# Patient Record
Sex: Female | Born: 1975 | Race: White | Hispanic: No | Smoking: Current every day smoker
Health system: Southern US, Community
[De-identification: ages and names within clinical notes are randomized; demographics above are authoritative.]

## PROBLEM LIST (undated history)

## (undated) DIAGNOSIS — D649 Anemia, unspecified: Secondary | ICD-10-CM

## (undated) DIAGNOSIS — F419 Anxiety disorder, unspecified: Secondary | ICD-10-CM

## (undated) DIAGNOSIS — I10 Essential (primary) hypertension: Secondary | ICD-10-CM

## (undated) DIAGNOSIS — E0590 Thyrotoxicosis, unspecified without thyrotoxic crisis or storm: Secondary | ICD-10-CM

## (undated) DIAGNOSIS — E0500 Thyrotoxicosis with diffuse goiter without thyrotoxic crisis or storm: Secondary | ICD-10-CM

## (undated) DIAGNOSIS — Z3480 Encounter for supervision of other normal pregnancy, unspecified trimester: Secondary | ICD-10-CM

## (undated) DIAGNOSIS — Z87440 Personal history of urinary (tract) infections: Secondary | ICD-10-CM

## (undated) DIAGNOSIS — K219 Gastro-esophageal reflux disease without esophagitis: Secondary | ICD-10-CM

## (undated) HISTORY — DX: Thyrotoxicosis, unspecified without thyrotoxic crisis or storm: E05.90

## (undated) HISTORY — DX: Essential (primary) hypertension: I10

## (undated) HISTORY — DX: Personal history of urinary (tract) infections: Z87.440

## (undated) HISTORY — PX: ECTOPIC PREGNANCY SURGERY: SHX613

## (undated) HISTORY — DX: Gastro-esophageal reflux disease without esophagitis: K21.9

## (undated) HISTORY — DX: Anxiety disorder, unspecified: F41.9

## (undated) HISTORY — PX: WISDOM TOOTH EXTRACTION: SHX21

---
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
## 2001-12-28 ENCOUNTER — Emergency Department (HOSPITAL_COMMUNITY): Admission: EM | Admit: 2001-12-28 | Discharge: 2001-12-28 | Payer: Self-pay | Admitting: Emergency Medicine

## 2001-12-28 ENCOUNTER — Encounter: Payer: Self-pay | Admitting: Emergency Medicine

## 2002-05-04 ENCOUNTER — Encounter: Payer: Self-pay | Admitting: *Deleted

## 2002-05-04 ENCOUNTER — Emergency Department (HOSPITAL_COMMUNITY): Admission: EM | Admit: 2002-05-04 | Discharge: 2002-05-04 | Payer: Self-pay | Admitting: *Deleted

## 2004-02-20 ENCOUNTER — Ambulatory Visit (HOSPITAL_COMMUNITY): Admission: RE | Admit: 2004-02-20 | Discharge: 2004-02-20 | Payer: Self-pay | Admitting: Family Medicine

## 2004-02-26 ENCOUNTER — Emergency Department (HOSPITAL_COMMUNITY): Admission: EM | Admit: 2004-02-26 | Discharge: 2004-02-26 | Payer: Self-pay | Admitting: *Deleted

## 2004-03-16 ENCOUNTER — Observation Stay (HOSPITAL_COMMUNITY): Admission: EM | Admit: 2004-03-16 | Discharge: 2004-03-17 | Payer: Self-pay | Admitting: *Deleted

## 2005-12-05 ENCOUNTER — Ambulatory Visit (HOSPITAL_COMMUNITY): Admission: RE | Admit: 2005-12-05 | Discharge: 2005-12-05 | Payer: Self-pay | Admitting: Obstetrics & Gynecology

## 2006-04-16 ENCOUNTER — Inpatient Hospital Stay (HOSPITAL_COMMUNITY): Admission: RE | Admit: 2006-04-16 | Discharge: 2006-04-18 | Payer: Self-pay | Admitting: Obstetrics & Gynecology

## 2007-09-01 ENCOUNTER — Emergency Department (HOSPITAL_COMMUNITY): Admission: EM | Admit: 2007-09-01 | Discharge: 2007-09-01 | Payer: Self-pay | Admitting: Emergency Medicine

## 2009-11-02 ENCOUNTER — Emergency Department (HOSPITAL_COMMUNITY): Admission: EM | Admit: 2009-11-02 | Discharge: 2009-11-02 | Payer: Self-pay | Admitting: Emergency Medicine

## 2010-02-03 ENCOUNTER — Emergency Department (HOSPITAL_COMMUNITY): Admission: EM | Admit: 2010-02-03 | Discharge: 2010-02-03 | Payer: Self-pay | Admitting: Emergency Medicine

## 2010-09-12 ENCOUNTER — Emergency Department (HOSPITAL_COMMUNITY): Admission: EM | Admit: 2010-09-12 | Discharge: 2010-02-18 | Payer: Self-pay | Admitting: Emergency Medicine

## 2010-12-23 LAB — URINALYSIS, ROUTINE W REFLEX MICROSCOPIC
Glucose, UA: NEGATIVE mg/dL
Ketones, ur: NEGATIVE mg/dL
Leukocytes, UA: NEGATIVE
Nitrite: NEGATIVE
Protein, ur: NEGATIVE mg/dL
pH: 6 (ref 5.0–8.0)

## 2010-12-23 LAB — POCT PREGNANCY, URINE: Preg Test, Ur: NEGATIVE

## 2010-12-23 LAB — URINE MICROSCOPIC-ADD ON

## 2011-02-14 ENCOUNTER — Emergency Department (HOSPITAL_COMMUNITY)
Admission: EM | Admit: 2011-02-14 | Discharge: 2011-02-14 | Disposition: A | Discharge status: Home / Self Care | Payer: Medicaid Other | Attending: Emergency Medicine | Admitting: Emergency Medicine

## 2011-02-14 DIAGNOSIS — R110 Nausea: Secondary | ICD-10-CM | POA: Insufficient documentation

## 2011-02-14 DIAGNOSIS — O99891 Other specified diseases and conditions complicating pregnancy: Secondary | ICD-10-CM | POA: Insufficient documentation

## 2011-02-14 LAB — URINE MICROSCOPIC-ADD ON

## 2011-02-14 LAB — URINALYSIS, ROUTINE W REFLEX MICROSCOPIC
Bilirubin Urine: NEGATIVE
Leukocytes, UA: NEGATIVE
Nitrite: NEGATIVE
Urobilinogen, UA: 0.2 mg/dL (ref 0.0–1.0)

## 2011-02-19 ENCOUNTER — Other Ambulatory Visit: Payer: Self-pay | Admitting: Family Medicine

## 2011-02-19 ENCOUNTER — Other Ambulatory Visit (HOSPITAL_COMMUNITY)
Admission: RE | Admit: 2011-02-19 | Discharge: 2011-02-19 | Disposition: A | Discharge status: Home / Self Care | Payer: Medicaid Other | Source: Ambulatory Visit | Attending: Obstetrics and Gynecology | Admitting: Obstetrics and Gynecology

## 2011-02-19 DIAGNOSIS — Z01419 Encounter for gynecological examination (general) (routine) without abnormal findings: Secondary | ICD-10-CM | POA: Insufficient documentation

## 2011-02-19 DIAGNOSIS — Z113 Encounter for screening for infections with a predominantly sexual mode of transmission: Secondary | ICD-10-CM | POA: Insufficient documentation

## 2011-02-21 NOTE — Group Therapy Note (Signed)
NAMEMarland Kitchen  [name], Kristin [name] NO.:  [id number]   MEDICAL RECORD NO.:  [id number]          PATIENT TYPE:  INP   LOCATION:  LDR1                          FACILITY:  [facility]   PHYSICIAN:  Tilda Burrow, M.D. DATE OF BIRTH:  [birth year]   DATE OF PROCEDURE:  DATE OF DISCHARGE:                                   PROGRESS NOTE   Kristin developed a strong urge to push at approximately 1630.  She still had  a anterior lip of cervix; however, it was easily reducible.  She pushed hard  and effectively for almost 2 hours and brought the baby down to crowning  position but just kind of wore out after that.  Dr. Emelda Fear came on to the  floor and a vacuum assistance and readily accepted.  The vacuum was placed  on the vertex and after 3 contractions and with pushing effort from the  patient and assistance with a vacuum the vertex delivered in an OA position.  There is a nuchal core which was not reducible.  The baby was delivered  through it.  The shoulder were delivered using a corkscrew maneuver and  rotating the baby into an oblique position.  Birth time is 60, Apgar's are  8 and 9.  The weight is 6 pounds even.  Twenty units of Pitocin diluted in  1000 cc of lactated ringers was infused rapidly IV.  The placenta separated  spontaneously; however, when controlled core traction was applied the cord  broke loose.  The patient was sat up into a squatting position and after one  push delivered the placenta.  It was inspected and appears to be intact with  a 3 vessel cord.  The vagina was then inspected and a second degree  laceration was found.  It was infiltrated with 15 cc of 1% Xylocaine and  repaired with a 2-0 Vicryl suture.  The epidural catheter was then removed  with  the blue tip visualized as being intact.  The placenta was delivered  at 1842.   ESTIMATED BLOOD LOSS:  250 cc.      Jacklyn Shell, C.N.M.      Tilda Burrow, M.D.  Electronically  Signed    FC/MEDQ  D:  04/16/2006  T:  04/16/2006  Job:  [id number]

## 2011-02-21 NOTE — H&P (Signed)
NAMEMarland Kitchen  Kristin [name], Kristin [name] NO.:  [id number]   MEDICAL RECORD NO.:  [id number]          PATIENT TYPE:  INP   LOCATION:  LDR1                          FACILITY:  [facility]   PHYSICIAN:  Tilda Burrow, M.D. DATE OF BIRTH:  1975/12/01   DATE OF ADMISSION:  04/16/2006  DATE OF DISCHARGE:  LH                                HISTORY & PHYSICAL   HISTORY OF PRESENT ILLNESS:  Kristin [name] is a 35 year old gravida 4, para 1, EDC  May 02, 2006, who was awakened this morning by first leaking of fluid at  approximately 3:20/3:15 and having uterine contractions.   MEDICAL HISTORY:  Negative.   SURGICAL HISTORY:  Positive for:  1.  D&C.  2.  Laparotomy for ectopic pregnancy.  3.  Wisdom tooth.   ALLERGIES:  She has no known allergies.   PRENATAL COURSE:  Essentially uneventful.  There was some issue with pain  management, but after discussion with the patient, that seemed to dissipate.  Blood type is O positive.  Rubella is immune.  Hepatitis B surface antigen  is negative.  HIV is negative.  Serology is nonreactive.  Dipstick of urine  negative.  Pap normal.  Twenty-eight-week hemoglobin 11.1, hematocrit 33.1.  One-hour glucose is 143.  GBS is negative.  Three-hour glucose is as  follows:  68, 104, 133 and 104.   PHYSICAL EXAMINATION:  VITAL SIGNS:  Stable.  Fetal heart rate pattern is  stable.  PELVIC:  She has gross rupture of membranes with a moderate-to-large amount  of clear fluid noted on admission.  Cervix is 3 cm, 80% effaced and -1  station.   PLAN:  We are going to admit and expect vaginal delivery.      Zerita Boers, Lanier Clam      Tilda Burrow, M.D.  Electronically Signed    DL/MEDQ  D:  16/07/9603  T:  04/16/2006  Job:  [id number]   cc:   Tilda Burrow, M.D.  Fax: [phone]   Francoise Schaumann. Milford Cage DO, FAAP  Fax: [phone]

## 2011-02-21 NOTE — Group Therapy Note (Signed)
NAME:  Kristin [name], Kristin [name] NO.:  [id number]   MEDICAL RECORD NO.:  [id number]          PATIENT TYPE:  OIB   LOCATION:  A416                          FACILITY:  [facility]   PHYSICIAN:  Tilda Burrow, M.D. DATE OF BIRTH:  [birth year]   DATE OF PROCEDURE:  DATE OF DISCHARGE:  12/05/2005                                   PROGRESS NOTE   HISTORY OF PRESENT ILLNESS:  [name] came to the office this morning with  complaints of abdominal pain that started about 45 minutes prior to arrival.  It was in her suprapubic area and radiated around to her left flank.  The  urine did look quite funky and she was having complaints of urinary  frequency.  She had blood in her urine and white blood cells, so she was  treated presumably for a UTI.  However, it did sound like there may be some  intestinal component to it also.  I have discharged her from the office with  instructions to get a Fleets enema at the drug store and if she did not get  relief from that, to call me back.  However, her pain increased and she went  ahead and came straight over to the hospital.  She received a milk and  molasses enema which she had good results and felt amazingly better after a  very good bowel movement.  She was given a prescription for Macrobid 100 mg  b.i.d. x7 for her UTI.   IMPRESSION:  1.  Intrauterine pregnancy at 18 weeks.  2.  Constipation relieved with enema.      Jacklyn Shell, C.N.M.      Tilda Burrow, M.D.  Electronically Signed    FC/MEDQ  D:  12/05/2005  T:  12/05/2005  Job:  [id number]   cc:   [facility]

## 2011-02-21 NOTE — Op Note (Signed)
NAMENAOMII, KREGER NO.:  [id number]   MEDICAL RECORD NO.:  [id number]          PATIENT TYPE:  INP   LOCATION:  A403                          FACILITY:  [facility]   PHYSICIAN:  Tilda Burrow, M.D. DATE OF BIRTH:  1976-09-26   DATE OF PROCEDURE:  04/16/2006  DATE OF DISCHARGE:  04/18/2006                                 OPERATIVE REPORT   PROCEDURE:  Epidural catheter placement.   INDICATION:  Active labor.   This patient was placed in the sitting position, flexed forward and back  prepped and draped in the usual fashion.  Continuous lumbar epidural  catheter was placed using loss-of-resistance technique identifying the  epidural space, with epidural catheter inserted a couple centimeters into  the epidural space.  The needle removed and catheter taped to the back.  A 5  mL test dose of 1.5% lidocaine with epinephrine initial bolus performed and  then 14 mL/h continuous infusion performed.  The patient tolerated procedure  well with symmetric analgesic effect.      Tilda Burrow, M.D.  Electronically Signed     JVF/MEDQ  D:  04/21/2006  T:  04/22/2006  Job:  [id number]

## 2011-02-21 NOTE — H&P (Signed)
NAMEJAIDE, [name] NO.:  [id number]   MEDICAL RECORD NO.:  [id number]          PATIENT TYPE:  INP   LOCATION:  LDR1                          FACILITY:  [facility]   PHYSICIAN:  Tilda Burrow, M.D. DATE OF BIRTH:  [birth year]   DATE OF ADMISSION:  04/16/2006  DATE OF DISCHARGE:  LH                                HISTORY & PHYSICAL   CHIEF COMPLAINT:  Spontaneous rupture of membranes at 0400.   HISTORY OF PRESENT ILLNESS:  Kristin [name] is a 35 year old gravida 4, para 0, AB 3  with an EDC of 05/02/06 based on first trimester ultrasound placing her at  about 37-1/[redacted] weeks gestation.  She began prenatal care early in her first  trimester and has had regular visits since then.  The pregnancy course has  basically been uneventful.  Prenatal blood pressures included 100-140/60-80.  Labs shows blood type 0 positive, rubella immune, HB, SAG, HIV RPR,  gonorrhea, Chlamydia area all negative.  Group B Strep. Is also negative.  A  1 hour GTT was abnormal at 143, however, her 3 hour GTT was subsequently  normal.  The total weight gain has been about 25 pounds with appropriate  fundal height growth.   PAST MEDICAL HISTORY:  Noncontributory.   PAST SURGICAL HISTORY:  Positive for D&C, 2 ectopic pregnancies and wisdom  teeth.   ALLERGIES:  No known drug allergies.   SOCIAL HISTORY:  She smokes 1/2 pack of cigarettes a day.  She denies  alcohol or drug use.   FAMILY HISTORY:  Noncontributory.   PHYSICAL EXAMINATION:  HEENT:  Within normal limits.  HEART:  Regular rate and rhythm.  LUNGS:  Clear.  ABDOMEN:  Soft and nontender.  She is having contractions about every 3  minutes of moderate intensity, fetal heart rate is reactive without  decelerations with baseline in the 140 range.  PELVIC EXAM;  On cervical exam it is 3 cm, 80% effaced, -1 station.  She has  leaking water, amount of clear amniotic fluid.  EXTREMITIES:  Legs show trace edema.   IMPRESSION:  Intrauterine  pregnancy at 37-1/2 weeks, ruptured membranes,  beginning labor.   PLAN:  The patient plans an epidural as she has had 2 doses of IV pain  medication since arrival here and is now requesting an epidural.  We will  probably start Pitocin, seeing as how her cervix is unchanged from admission  4 hours ago.      Jacklyn Shell, C.N.M.      Tilda Burrow, M.D.  Electronically Signed    FC/MEDQ  D:  04/16/2006  T:  04/16/2006  Job:  [id number]

## 2011-02-21 NOTE — H&P (Signed)
NAME:  Kristin [name], Kristin [name] NO.:  [id number]   MEDICAL RECORD NO.:  [id number]                   PATIENT TYPE:  INP   LOCATION:  A417                                 FACILITY:  [facility]   PHYSICIAN:  Lazaro Arms, M.D.                DATE OF BIRTH:  May 27, 1976   DATE OF ADMISSION:  03/15/2004  DATE OF DISCHARGE:                                HISTORY & PHYSICAL   Kristin [name] is a 35 year old white female, gravida 3, para 0, spontaneous  miscarriage 1, ectopic 1, with last menstrual period she states May 27.  The  patient states that was a normal period; she bled for a full 6 days, and it  was normal volume.  She began experiencing pain in the lower abdomen at  approximately 8 o'clock on June 9.  She presented to the ER last evening  complaining of lower pelvic pain.  A quantitative HCG was drawn and is 979.  She had a vaginal probe ultrasound which showed free fluid in the pelvis.  They actually read it out as being hemoperitoneum, but I viewed it myself,  and it could be a ruptured hemorrhagic corpus luteum.  In any event, the  adnexa is completely negative.  The endometrial stripe is quite thin, almost  not even a leuteal phase endometrial stripe.   On examination this morning, she has I would say mild lower abdominal  tenderness.  There is no rebound at all.  Pelvic exam is not performed  today.  The patient states her pain is much better.  She is admitted,  however, for observation, evaluation, and laboratory data.   PAST MEDICAL HISTORY:  Negative.   PAST SURGICAL HISTORY:  She had the laparotomy for ectopic.  She is unsure  which side, and she is not sure if they saved her tube.  Additionally, she  had a D&C for miscarriage.   PAST OBSTETRICAL HISTORY:  As above.   ALLERGIES:  None.   MEDICATIONS:  She has recently taken ciprofloxacin for urinary tract  infection.   PHYSICAL EXAMINATION:  HEENT:  Unremarkable.  NECK:  Thyroid normal.  LUNGS:   Clear.  HEART:  Respiratory rate without regurgitation or gallop.  BREASTS:  Deferred.  ABDOMEN:  Benign except for some tenderness in the lower abdomen, both  sides, no rebound.  One side is not greater than the other.  EXTREMITIES:  Warm with no edema.  NEUROLOGIC:  Grossly intact.   IMPRESSION:  1. Early pregnancy, questionable ectopic versus intrauterine versus     nonviable.  2. Free fluid in the pelvis, could be blood or could be fluid from a     ruptured hemorrhagic corpus luteum.   PLAN:  The patient is admitted for observation. We will repeat her labs in  the morning with a CBC, quantitative HCG, and serum progesterone.  Based on  that, we will proceed from there.  She  is kept n.p.o. overnight.     ___________________________________________                                         Lazaro Arms, M.D.   LHE/MEDQ  D:  03/16/2004  T:  03/16/2004  Job:  [id number]

## 2011-05-08 ENCOUNTER — Encounter (HOSPITAL_COMMUNITY): Payer: Self-pay | Admitting: *Deleted

## 2011-05-08 ENCOUNTER — Emergency Department (HOSPITAL_COMMUNITY)
Admission: EM | Admit: 2011-05-08 | Discharge: 2011-05-08 | Discharge status: Against Medical Advice | Payer: Medicaid Other | Attending: Emergency Medicine | Admitting: Emergency Medicine

## 2011-05-08 ENCOUNTER — Encounter (HOSPITAL_COMMUNITY): Payer: Self-pay | Admitting: Emergency Medicine

## 2011-05-08 ENCOUNTER — Emergency Department (HOSPITAL_COMMUNITY)
Admission: EM | Admit: 2011-05-08 | Discharge: 2011-05-08 | Disposition: A | Discharge status: Home / Self Care | Payer: Medicaid Other | Attending: Emergency Medicine | Admitting: Emergency Medicine

## 2011-05-08 DIAGNOSIS — R059 Cough, unspecified: Secondary | ICD-10-CM | POA: Insufficient documentation

## 2011-05-08 DIAGNOSIS — O2390 Unspecified genitourinary tract infection in pregnancy, unspecified trimester: Secondary | ICD-10-CM | POA: Insufficient documentation

## 2011-05-08 DIAGNOSIS — R109 Unspecified abdominal pain: Secondary | ICD-10-CM

## 2011-05-08 DIAGNOSIS — R05 Cough: Secondary | ICD-10-CM | POA: Insufficient documentation

## 2011-05-08 DIAGNOSIS — O26899 Other specified pregnancy related conditions, unspecified trimester: Secondary | ICD-10-CM

## 2011-05-08 DIAGNOSIS — N390 Urinary tract infection, site not specified: Secondary | ICD-10-CM | POA: Insufficient documentation

## 2011-05-08 LAB — BASIC METABOLIC PANEL
CO2: 19 mEq/L (ref 19–32)
Potassium: 3.8 mEq/L (ref 3.5–5.1)

## 2011-05-08 LAB — CBC
Hemoglobin: 12.2 g/dL (ref 12.0–15.0)
MCH: 31 pg (ref 26.0–34.0)
MCV: 89.3 fL (ref 78.0–100.0)
Platelets: 170 10*3/uL (ref 150–400)
RBC: 3.93 MIL/uL (ref 3.87–5.11)
WBC: 13.9 10*3/uL — ABNORMAL HIGH (ref 4.0–10.5)

## 2011-05-08 LAB — URINALYSIS, ROUTINE W REFLEX MICROSCOPIC
Protein, ur: NEGATIVE mg/dL
Specific Gravity, Urine: 1.015 (ref 1.005–1.030)
Urobilinogen, UA: 0.2 mg/dL (ref 0.0–1.0)

## 2011-05-08 LAB — URINE MICROSCOPIC-ADD ON

## 2011-05-08 LAB — DIFFERENTIAL
Basophils Absolute: 0 10*3/uL (ref 0.0–0.1)
Basophils Relative: 0 % (ref 0–1)
Eosinophils Absolute: 0.2 10*3/uL (ref 0.0–0.7)
Eosinophils Relative: 1 % (ref 0–5)
Lymphs Abs: 2 10*3/uL (ref 0.7–4.0)

## 2011-05-08 LAB — POCT PREGNANCY, URINE: Preg Test, Ur: POSITIVE

## 2011-05-08 MED ORDER — NITROFURANTOIN MACROCRYSTAL 100 MG PO CAPS: 100.0000 mg | ORAL_CAPSULE | Freq: Four times a day (QID) | ORAL | Status: AC

## 2011-05-08 NOTE — ED Provider Notes (Addendum)
History    Chart scribed for Hilario Quarry, MD by Enos Fling; the patient was seen in room APA02/APA02; this patient's care was started at 7:15 AM.   CSN: [id number] Arrival date & time: 05/08/2011  4:06 AM  Chief Complaint  Patient presents with  . Abdominal Cramping   HPI Kristin [name] is a 35 y.o. female who presents to the Emergency Department complaining of abd pain. Pt reports she woke up 5 hours ago to urinate, was able to void but c/o dull constant ache to right lower abd at that time, lasting approx 1 hoour. Pain has resolved. Pt has urinated normally since then. No n/v/d. No abd cramping. G2P1LNMP February, December 22nd due date. Prenatal vitamins daily. Ob Dr. Emelda Fear. No past medical history on file.  No past surgical history on file.  No family history on file.  History  Substance Use Topics  . Smoking status: Not on file  . Smokeless tobacco: Not on file  . Alcohol Use: Not on file    OB History    No data available      Review of Systems  Constitutional: Negative for fever, diaphoresis and appetite change.  HENT: Negative for neck pain.   Respiratory: Negative for shortness of breath.   Cardiovascular: Negative for chest pain.  Gastrointestinal: Positive for abdominal pain. Negative for nausea, vomiting and diarrhea.  Genitourinary: Positive for dysuria. Negative for urgency, frequency, vaginal bleeding and pelvic pain.  Musculoskeletal: Negative for myalgias.  Skin: Negative for pallor.  Neurological: Negative for dizziness and headaches.    Physical Exam  BP 104/62  Pulse 70  Resp 16  SpO2 100%  Physical Exam  Constitutional: She is oriented to person, place, and time. She appears well-developed and well-nourished. No distress.  HENT:  Head: Normocephalic.  Right Ear: Tympanic membrane and external ear normal.  Left Ear: Tympanic membrane and external ear normal.  Nose: Nose normal.  Mouth/Throat: Oropharynx is clear and moist.  Eyes: EOM  are normal. Pupils are equal, round, and reactive to light.  Neck: Normal range of motion. Neck supple. No thyromegaly present.  Cardiovascular: Normal rate, regular rhythm, normal heart sounds and intact distal pulses.   Pulmonary/Chest: Effort normal and breath sounds normal.       Lungs CTA with mild coughing.  Abdominal: Soft. Bowel sounds are normal. There is no tenderness.       Gravid consistent with dates  Musculoskeletal: Normal range of motion. She exhibits no edema and no tenderness.  Lymphadenopathy:    She has no cervical adenopathy.  Neurological: She is alert and oriented to person, place, and time.  Skin: Skin is warm and dry.    ED Course  Procedures Results for orders placed during the hospital encounter of 05/08/11  URINALYSIS, ROUTINE W REFLEX MICROSCOPIC      Component Value Range   Color, Urine YELLOW  YELLOW    Appearance HAZY (*) CLEAR    Specific Gravity, Urine 1.015  1.005 - 1.030    pH 7.0  5.0 - 8.0    Glucose, UA NEGATIVE  NEGATIVE (mg/dL)   Hgb urine dipstick TRACE (*) NEGATIVE    Bilirubin Urine NEGATIVE  NEGATIVE    Ketones, ur NEGATIVE  NEGATIVE (mg/dL)   Protein, ur NEGATIVE  NEGATIVE (mg/dL)   Urobilinogen, UA 0.2  0.0 - 1.0 (mg/dL)   Nitrite NEGATIVE  NEGATIVE    Leukocytes, UA NEGATIVE  NEGATIVE   POCT PREGNANCY, URINE  Component Value Range   Preg Test, Ur POSITIVE    URINE MICROSCOPIC-ADD ON      Component Value Range   Squamous Epithelial / LPF MANY (*) RARE    WBC, UA 3-6  <3 (WBC/hpf)   RBC / HPF 3-6  <3 (RBC/hpf)   Bacteria, UA MANY (*) RARE   CBC      Component Value Range   WBC 13.9 (*) 4.0 - 10.5 (K/uL)   RBC 3.93  3.87 - 5.11 (MIL/uL)   Hemoglobin 12.2  12.0 - 15.0 (g/dL)   HCT 47.8 (*) 29.5 - 46.0 (%)   MCV 89.3  78.0 - 100.0 (fL)   MCH 31.0  26.0 - 34.0 (pg)   MCHC 34.8  30.0 - 36.0 (g/dL)   RDW 62.1  30.8 - 65.7 (%)   Platelets 170  150 - 400 (K/uL)  DIFFERENTIAL      Component Value Range   Neutrophils  Relative 80 (*) 43 - 77 (%)   Neutro Abs 11.1 (*) 1.7 - 7.7 (K/uL)   Lymphocytes Relative 14  12 - 46 (%)   Lymphs Abs 2.0  0.7 - 4.0 (K/uL)   Monocytes Relative 5  3 - 12 (%)   Monocytes Absolute 0.7  0.1 - 1.0 (K/uL)   Eosinophils Relative 1  0 - 5 (%)   Eosinophils Absolute 0.2  0.0 - 0.7 (K/uL)   Basophils Relative 0  0 - 1 (%)   Basophils Absolute 0.0  0.0 - 0.1 (K/uL)   WBC Morphology ATYPICAL LYMPHOCYTES    BASIC METABOLIC PANEL      Component Value Range   Sodium 134 (*) 135 - 145 (mEq/L)   Potassium 3.8  3.5 - 5.1 (mEq/L)   Chloride 103  96 - 112 (mEq/L)   CO2 19  19 - 32 (mEq/L)   Glucose, Bld 92  70 - 99 (mg/dL)   BUN 8  6 - 23 (mg/dL)   Creatinine, Ser <8.46 (*) 0.50 - 1.10 (mg/dL)   Calcium 9.1  8.4 - 96.2 (mg/dL)   GFR calc non Af Amer NOT CALCULATED  >60 (mL/min)   GFR calc Af Amer NOT CALCULATED  >60 (mL/min)    MDM Resolved abd pain and minimal dysuria; UA shows UTI; d/c with Macrodantin  I personally performed the services described in this documentation, which was scribed in my presence. The recorded information has been reviewed and considered. No att. providers found       Hilario Quarry, MD 05/09/11 9528  Hilario Quarry, MD 05/09/11 [phone]

## 2011-05-08 NOTE — ED Notes (Signed)
Pt states no pain at present but when came in had a pain that she could not describe to r lower abd radiating around flank. Pain better with urinating. nad at this time. Aware edp will be in asap. Color wnl. No bleeding. States the pain did not feel like a cramping.

## 2011-05-08 NOTE — ED Notes (Signed)
Sudden abd pain on rt side, occurred just when pt woke up to go to bathroom, denies vag. Bleeding or N/V, [redacted] weeks pregnant

## 2011-05-28 ENCOUNTER — Encounter (HOSPITAL_COMMUNITY): Payer: Self-pay | Admitting: Emergency Medicine

## 2011-06-22 ENCOUNTER — Encounter (HOSPITAL_COMMUNITY): Payer: Self-pay | Admitting: *Deleted

## 2011-06-22 ENCOUNTER — Inpatient Hospital Stay (HOSPITAL_COMMUNITY)
Admission: AD | Admit: 2011-06-22 | Discharge: 2011-06-22 | Disposition: A | Discharge status: Home / Self Care | Payer: Medicaid Other | Source: Ambulatory Visit | Attending: Family Medicine | Admitting: Family Medicine

## 2011-06-22 DIAGNOSIS — T148XXA Other injury of unspecified body region, initial encounter: Secondary | ICD-10-CM

## 2011-06-22 DIAGNOSIS — IMO0002 Reserved for concepts with insufficient information to code with codable children: Secondary | ICD-10-CM | POA: Insufficient documentation

## 2011-06-22 DIAGNOSIS — O99891 Other specified diseases and conditions complicating pregnancy: Secondary | ICD-10-CM | POA: Insufficient documentation

## 2011-06-22 DIAGNOSIS — R109 Unspecified abdominal pain: Secondary | ICD-10-CM | POA: Insufficient documentation

## 2011-06-22 NOTE — ED Provider Notes (Signed)
History     Chief Complaint  Patient presents with  . Groin Pain   HPI In with c/o pulling R groin muscle yesterday while playing soccer with her daughter. States felt it pull and then afterwards started having R groin pain.  OB History    Grav Para Term Preterm Abortions TAB SAB Ect Mult Living   4 1 1  2   2  1       Past Medical History  Diagnosis Date  . UTI (lower urinary tract infection)     Past Surgical History  Procedure Date  . Ectopic pregnancy surgery   . No past surgeries     No family history on file.  History  Substance Use Topics  . Smoking status: Current Everyday Smoker -- 0.5 packs/day    Types: Cigarettes  . Smokeless tobacco: Not on file  . Alcohol Use: No    Allergies: No Known Allergies  Prescriptions prior to admission  Medication Sig Dispense Refill  . fluconazole (DIFLUCAN) 150 MG tablet Take 150 mg by mouth once.        . Prenatal Vit-Fe Fumarate-FA (PRENATAL MULTIVITAMIN) 60-1 MG tablet Take 1 tablet by mouth daily.          Review of Systems  Constitutional: Negative.   HENT: Negative.   Eyes: Negative.   Respiratory: Negative.   Cardiovascular: Negative.   Gastrointestinal: Negative.   Genitourinary: Negative.   Musculoskeletal:       R groin pain with ambulation  Skin: Negative.   Neurological: Negative.   Endo/Heme/Allergies: Negative.   Psychiatric/Behavioral: Negative.    Physical Exam   Blood pressure 111/54, pulse 84, temperature 98.3 F (36.8 C), resp. rate 18, height 5\' 4"  (1.626 m), weight 63.05 kg (139 lb).  Physical Exam  Constitutional: She is oriented to person, place, and time. She appears well-developed and well-nourished.  HENT:  Head: Normocephalic.  Cardiovascular: Normal rate, regular rhythm and normal heart sounds.   Respiratory: Effort normal and breath sounds normal.  GI: Soft. Bowel sounds are normal.  Genitourinary: Vagina normal and uterus normal.  Musculoskeletal: Normal range of motion.    Neurological: She is alert and oriented to person, place, and time. She has normal reflexes.  Skin: Skin is warm and dry.  Psychiatric: She has a normal mood and affect. Her behavior is normal. Judgment and thought content normal.    MAU Course  Procedures  MDM   Assessment and Plan  Muscle strain. Motrin 600 tid for up to 1 week.  Zerita Boers 06/22/2011, 12:43 PM

## 2011-06-22 NOTE — Progress Notes (Signed)
Was playing soccer with her son felt pull in right groin and has been very painful ever since, positive fm, no vaginal bleeding.

## 2011-06-22 NOTE — ED Provider Notes (Signed)
Chart reviewed and agree with management and plan.  

## 2011-07-03 ENCOUNTER — Inpatient Hospital Stay (HOSPITAL_COMMUNITY)
Admission: AD | Admit: 2011-07-03 | Discharge: 2011-07-03 | Disposition: A | Discharge status: Home / Self Care | Payer: Self-pay | Source: Ambulatory Visit | Attending: Obstetrics and Gynecology | Admitting: Obstetrics and Gynecology

## 2011-07-03 DIAGNOSIS — R55 Syncope and collapse: Secondary | ICD-10-CM

## 2011-07-03 DIAGNOSIS — D649 Anemia, unspecified: Secondary | ICD-10-CM | POA: Insufficient documentation

## 2011-07-03 DIAGNOSIS — O99019 Anemia complicating pregnancy, unspecified trimester: Secondary | ICD-10-CM | POA: Insufficient documentation

## 2011-07-03 LAB — URINALYSIS, ROUTINE W REFLEX MICROSCOPIC
Bilirubin Urine: NEGATIVE
Glucose, UA: NEGATIVE mg/dL
Hgb urine dipstick: NEGATIVE
Nitrite: NEGATIVE
Specific Gravity, Urine: 1.01 (ref 1.005–1.030)
Urobilinogen, UA: 0.2 mg/dL (ref 0.0–1.0)
pH: 7.5 (ref 5.0–8.0)

## 2011-07-03 LAB — CBC
MCV: 91.7 fL (ref 78.0–100.0)
Platelets: 147 10*3/uL — ABNORMAL LOW (ref 150–400)
RBC: 3.49 MIL/uL — ABNORMAL LOW (ref 3.87–5.11)
WBC: 12.3 10*3/uL — ABNORMAL HIGH (ref 4.0–10.5)

## 2011-07-03 NOTE — Progress Notes (Signed)
Pt reports she has been having dizzy spells for the last 2 weeks, off/on. Today while she was in grocery store she felt like she was going to pass out. Today has had a "discomfort/cramping feeling" in lower abd.

## 2011-07-03 NOTE — Progress Notes (Signed)
SAYS SHE WAS AT [facility]- AND FELT DIZZY - DID NOT PASS OUT - HAS BEEN FEELING LIGHT HEADED-  WENT HOME AND ATE AT 730.   DOES NOT FEEL  NOW LIKE SHE  DID THEN.   DENIES UC.  HAS APPOINTMENT  ON WED.Marland Kitchen  HAS BEEN FEELING THIS WAY X2 WEEKS - HAS NOT CALLED OFFICE.

## 2011-07-03 NOTE — Progress Notes (Signed)
UP TO B-ROOM 

## 2011-07-03 NOTE — Progress Notes (Signed)
D/C HOME- NOT DIZZY

## 2011-07-03 NOTE — ED Provider Notes (Signed)
Kristin [name] is a 35 y.o. year old G43P1021 female at [redacted]w[redacted]d weeks gestation who presents to MAU reporting dizziness and near-syncope while walking around [place] that resolved after eating. She has had a few other similar, but less severe episodes in the past few weeks. She denies UC's , VB or LOF and reports pos FM  History OB History    Grav Para Term Preterm Abortions TAB SAB Ect Mult Living   4 1 1  2   2  1      Past Medical History  Diagnosis Date  . UTI (lower urinary tract infection)    Past Surgical History  Procedure Date  . Ectopic pregnancy surgery   . No past surgeries    Family History: family history is not on file. Social History:  reports that she has been smoking Cigarettes.  She has been smoking about .5 packs per day. She does not have any smokeless tobacco history on file. She reports that she does not drink alcohol or use illicit drugs.  ROS: Otherwise neg.    Blood pressure 122/68, pulse 84, temperature 97.4 F (36.3 C), resp. rate 18, height 5\' 3"  (1.6 m), weight 65.318 kg (144 lb), SpO2 98.00%. Maternal Exam:  Uterine Assessment: none  Abdomen: Patient reports no abdominal tenderness. Introitus: not evaluated.   Cervix: not evaluated.   Fetal Exam Fetal Monitor Review: Mode: ultrasound.   Baseline rate: 140-150.  Variability: moderate (6-25 bpm).   Pattern: no accelerations and no decelerations.    Fetal State Assessment: Category II - tracings are indeterminate.     Physical Exam  Prenatal labs: ABO, Rh:   Antibody:   Rubella:   RPR:    HBsAg:    HIV:    GBS:    Results for orders placed during the hospital encounter of 07/03/11 (from the past 24 hour(s))  URINALYSIS, ROUTINE W REFLEX MICROSCOPIC     Status: Normal   Collection Time   07/03/11  7:55 PM      Component Value Range   Color, Urine YELLOW  YELLOW    Appearance CLEAR  CLEAR    Specific Gravity, Urine 1.010  1.005 - 1.030    pH 7.5  5.0 - 8.0    Glucose, UA NEGATIVE   NEGATIVE (mg/dL)   Hgb urine dipstick NEGATIVE  NEGATIVE    Bilirubin Urine NEGATIVE  NEGATIVE    Ketones, ur NEGATIVE  NEGATIVE (mg/dL)   Protein, ur NEGATIVE  NEGATIVE (mg/dL)   Urobilinogen, UA 0.2  0.0 - 1.0 (mg/dL)   Nitrite NEGATIVE  NEGATIVE    Leukocytes, UA NEGATIVE  NEGATIVE   CBC     Status: Abnormal   Collection Time   07/03/11  9:12 PM      Component Value Range   WBC 12.3 (*) 4.0 - 10.5 (K/uL)   RBC 3.49 (*) 3.87 - 5.11 (MIL/uL)   Hemoglobin 10.9 (*) 12.0 - 15.0 (g/dL)   HCT 54.0 (*) 98.1 - 46.0 (%)   MCV 91.7  78.0 - 100.0 (fL)   MCH 31.2  26.0 - 34.0 (pg)   MCHC 34.1  30.0 - 36.0 (g/dL)   RDW 19.1  47.8 - 29.5 (%)   Platelets 147 (*) 150 - 400 (K/uL)    Basename 07/03/11 2146  GLUCAP 99   Assessment/Plan: Assessment: 1. Anemia 2. Possible hypoglycemia, resolved w/ eating 3. FHR category II, appropriate for gestation  Plan: 1. D/C home 2. Recommend small, frequent meals 3. Start FeSo4 325  mg PO BID and increase iron-rich foods 4. PTL precautions   Dorathy Kinsman 07/03/2011, 9:33 PM

## 2011-07-04 NOTE — ED Provider Notes (Signed)
Agree with above note.  Kristin [name] 07/04/2011 7:45 AM

## 2011-09-08 ENCOUNTER — Inpatient Hospital Stay (HOSPITAL_COMMUNITY)
Admission: AD | Admit: 2011-09-08 | Discharge: 2011-09-08 | Disposition: A | Discharge status: Home / Self Care | Payer: Medicaid Other | Source: Ambulatory Visit | Attending: Obstetrics & Gynecology | Admitting: Obstetrics & Gynecology

## 2011-09-08 ENCOUNTER — Encounter (HOSPITAL_COMMUNITY): Payer: Self-pay

## 2011-09-08 DIAGNOSIS — N390 Urinary tract infection, site not specified: Secondary | ICD-10-CM | POA: Insufficient documentation

## 2011-09-08 DIAGNOSIS — O2390 Unspecified genitourinary tract infection in pregnancy, unspecified trimester: Secondary | ICD-10-CM | POA: Insufficient documentation

## 2011-09-08 DIAGNOSIS — R109 Unspecified abdominal pain: Secondary | ICD-10-CM | POA: Insufficient documentation

## 2011-09-08 HISTORY — DX: Anemia, unspecified: D64.9

## 2011-09-08 LAB — URINALYSIS, ROUTINE W REFLEX MICROSCOPIC
Nitrite: NEGATIVE
Specific Gravity, Urine: 1.01 (ref 1.005–1.030)
Urobilinogen, UA: 0.2 mg/dL (ref 0.0–1.0)

## 2011-09-08 LAB — URINE MICROSCOPIC-ADD ON

## 2011-09-08 MED ORDER — PHENAZOPYRIDINE HCL 200 MG PO TABS: 200.0000 mg | ORAL_TABLET | Freq: Three times a day (TID) | ORAL | Status: AC

## 2011-09-08 MED ORDER — PHENAZOPYRIDINE HCL 100 MG PO TABS: 200.0000 mg | ORAL_TABLET | Freq: Three times a day (TID) | ORAL | Status: DC

## 2011-09-08 MED ORDER — CEPHALEXIN 500 MG PO CAPS: 500.0000 mg | ORAL_CAPSULE | Freq: Four times a day (QID) | ORAL | Status: AC

## 2011-09-08 MED ORDER — PHENAZOPYRIDINE HCL 100 MG PO TABS
200.0000 mg | ORAL_TABLET | Freq: Once | ORAL | Status: AC
  Administered 2011-09-08: 200 mg via ORAL
  Filled 2011-09-08: qty 2

## 2011-09-08 NOTE — Progress Notes (Signed)
Pt states started noticing s/s of uti this am at 0100, voids very little when she pees, intermittent discomfort. +FM, denies bleeding or lof.

## 2011-09-09 LAB — STREP B DNA PROBE: GBS: NEGATIVE

## 2011-10-02 LAB — ABO/RH: RH Type: POSITIVE

## 2011-10-02 LAB — GC/CHLAMYDIA PROBE AMP, GENITAL
Chlamydia: NEGATIVE
Gonorrhea: NEGATIVE

## 2011-10-02 LAB — RUBELLA ANTIBODY, IGM: Rubella: IMMUNE

## 2011-10-04 ENCOUNTER — Inpatient Hospital Stay (HOSPITAL_COMMUNITY)
Admission: AD | Admit: 2011-10-04 | Discharge: 2011-10-04 | Disposition: A | Discharge status: Home / Self Care | Payer: Medicaid Other | Source: Ambulatory Visit | Attending: Obstetrics & Gynecology | Admitting: Obstetrics & Gynecology

## 2011-10-04 ENCOUNTER — Encounter (HOSPITAL_COMMUNITY): Payer: Self-pay

## 2011-10-04 DIAGNOSIS — R109 Unspecified abdominal pain: Secondary | ICD-10-CM | POA: Insufficient documentation

## 2011-10-04 DIAGNOSIS — O479 False labor, unspecified: Secondary | ICD-10-CM | POA: Insufficient documentation

## 2011-10-04 NOTE — Progress Notes (Signed)
Patient is here with c/o abodminal cramping since 2200pm. She states that she isn't sure if it is contractions, was 4cm last week at her doctor's office. She denies any vaginal bleeding, lof. She reports good fetal movement.

## 2011-10-04 NOTE — ED Provider Notes (Signed)
History     Chief Complaint  Patient presents with  . Abdominal Cramping   HPI This is a 35 year old G4 P1 0-1 at 40 weeks and 2 days who presents the MAU with contractions that started approximately 10 PM. The contractions started become more intense and regular and coming approximately every 3 minutes. Arrives the contractions have decreased in intensity although is still approximately 3 minutes apart. She denies decreased fetal activity, vaginal discharge, vaginal bleeding, abdominal pain, headache, vision changes.  OB History    Grav Para Term Preterm Abortions TAB SAB Ect Mult Living   4 1 1  2   2  1       Past Medical History  Diagnosis Date  . UTI (lower urinary tract infection)   . Anemia     Past Surgical History  Procedure Date  . Ectopic pregnancy surgery   . Wisdom tooth extraction     History reviewed. No pertinent family history.  History  Substance Use Topics  . Smoking status: Current Everyday Smoker -- 0.5 packs/day    Types: Cigarettes  . Smokeless tobacco: Never Used  . Alcohol Use: No    Allergies: No Known Allergies  Prescriptions prior to admission  Medication Sig Dispense Refill  . acetaminophen (TYLENOL) 325 MG tablet Take 650 mg by mouth every 6 (six) hours as needed. For headache       . ferrous sulfate 325 (65 FE) MG tablet Take 325 mg by mouth daily with breakfast.        . prenatal vitamin w/FE, FA (PRENATAL 1 + 1) 27-1 MG TABS Take 1 tablet by mouth daily.        Marland Kitchen PRESCRIPTION MEDICATION Place 3 drops into the left ear daily. Antibiotic ear drop         Review of Systems  All other systems reviewed and are negative.   Physical Exam   Blood pressure 132/69, pulse 74, temperature 98.7 F (37.1 C), temperature source Oral, resp. rate 18, height 5\' 3"  (1.6 m), weight 72.632 kg (160 lb 2 oz), SpO2 97.00%.  Physical Exam  Constitutional: She is oriented to person, place, and time. She appears well-developed and well-nourished.    Cardiovascular: Normal rate.   Respiratory: Effort normal.  GI: Soft. Bowel sounds are normal. She exhibits no distension and no mass. There is no tenderness. There is no rebound and no guarding.  Neurological: She is alert and oriented to person, place, and time.  Skin: Skin is warm and dry.  Psychiatric: She has a normal mood and affect. Her behavior is normal. Judgment and thought content normal.   Dilation: 4 Effacement (%): 80 Station: -2 Presentation: Vertex Exam by:: dr Adrian Blackwater  Fetal heart tracing: Baseline rate of 140s with moderate variability and accelerations.  MAU Course  Procedures   Assessment and Plan  #1 35 Job G4 P1 0-1 at 40 weeks and 2 days #2 early labor  As patient hasn't had any cervical change since earlier this week, the patient is not in active labor. Instructions given the patient to return to her contractions increasing at approximately every 3 minutes apart or if they increase in intensity. Where she feels pressure.  STINSON, JACOB JEHIEL 10/04/2011, 4:39 AM

## 2011-10-07 NOTE — L&D Delivery Note (Signed)
Delivery Note At 12:53 PM a viable and healthy female was delivered via Vaginal, Spontaneous Delivery (Presentation: Middle Occiput Anterior).  APGAR: 9/9 , ; weight 6 lb 13.9 oz (3115 g).   Placenta status: Intact, Spontaneous.  Cord: 3 vessels with the following complications: Meconium stained placenta  Cord pH: n/a  No difficulty with shoulders but there was a compound hand presentation/nuchal hand.  Anesthesia: Epidural  Episiotomy: None Lacerations: 1st degree;Perineal Suture Repair: 3.0 vicryl rapide Est. Blood Loss (mL):   Mom to postpartum.  Baby to nursery-stable.  Wynelle Bourgeois 10/08/2011, 1:18 PM

## 2011-10-08 ENCOUNTER — Inpatient Hospital Stay (HOSPITAL_COMMUNITY): Payer: Medicaid Other | Admitting: Anesthesiology

## 2011-10-08 ENCOUNTER — Encounter (HOSPITAL_COMMUNITY)
Admission: AD | Disposition: A | Discharge status: Home / Self Care | Payer: Self-pay | Source: Ambulatory Visit | Attending: Obstetrics & Gynecology

## 2011-10-08 ENCOUNTER — Encounter (HOSPITAL_COMMUNITY): Payer: Self-pay | Admitting: *Deleted

## 2011-10-08 ENCOUNTER — Encounter (HOSPITAL_COMMUNITY): Payer: Self-pay | Admitting: Anesthesiology

## 2011-10-08 ENCOUNTER — Inpatient Hospital Stay (HOSPITAL_COMMUNITY)
Admission: AD | Admit: 2011-10-08 | Discharge: 2011-10-10 | DRG: 767 | Disposition: A | Discharge status: Home / Self Care | Payer: Medicaid Other | Source: Ambulatory Visit | Attending: Obstetrics & Gynecology | Admitting: Obstetrics & Gynecology

## 2011-10-08 DIAGNOSIS — Z3480 Encounter for supervision of other normal pregnancy, unspecified trimester: Secondary | ICD-10-CM

## 2011-10-08 DIAGNOSIS — O09529 Supervision of elderly multigravida, unspecified trimester: Secondary | ICD-10-CM | POA: Diagnosis present

## 2011-10-08 DIAGNOSIS — O99334 Smoking (tobacco) complicating childbirth: Secondary | ICD-10-CM | POA: Diagnosis present

## 2011-10-08 DIAGNOSIS — Z302 Encounter for sterilization: Secondary | ICD-10-CM

## 2011-10-08 DIAGNOSIS — Z9851 Tubal ligation status: Secondary | ICD-10-CM

## 2011-10-08 HISTORY — DX: Encounter for supervision of other normal pregnancy, unspecified trimester: Z34.80

## 2011-10-08 HISTORY — PX: TUBAL LIGATION: SHX77

## 2011-10-08 LAB — CBC
HCT: 36.5 % (ref 36.0–46.0)
Hemoglobin: 12.7 g/dL (ref 12.0–15.0)
MCH: 31.9 pg (ref 26.0–34.0)
MCV: 91.7 fL (ref 78.0–100.0)
RBC: 3.98 MIL/uL (ref 3.87–5.11)
WBC: 11.7 10*3/uL — ABNORMAL HIGH (ref 4.0–10.5)

## 2011-10-08 SURGERY — LIGATION, FALLOPIAN TUBE, POSTPARTUM
Anesthesia: Epidural | Site: Abdomen | Laterality: Bilateral | Wound class: Clean Contaminated

## 2011-10-08 MED ORDER — METOCLOPRAMIDE HCL 10 MG PO TABS: 10.0000 mg | ORAL_TABLET | Freq: Once | ORAL | Status: DC

## 2011-10-08 MED ORDER — FAMOTIDINE 20 MG PO TABS: 40.0000 mg | ORAL_TABLET | Freq: Once | ORAL | Status: DC

## 2011-10-08 MED ORDER — IBUPROFEN 600 MG PO TABS: 600.0000 mg | ORAL_TABLET | Freq: Four times a day (QID) | ORAL | Status: DC | PRN

## 2011-10-08 MED ORDER — LIDOCAINE HCL 1.5 % IJ SOLN
INTRAMUSCULAR | Status: DC | PRN
  Administered 2011-10-08 (×2): 5 mL via EPIDURAL

## 2011-10-08 MED ORDER — PROMETHAZINE HCL 25 MG/ML IJ SOLN: 6.2500 mg | INTRAMUSCULAR | Status: DC | PRN

## 2011-10-08 MED ORDER — DIPHENHYDRAMINE HCL 50 MG/ML IJ SOLN: 12.5000 mg | INTRAMUSCULAR | Status: DC | PRN

## 2011-10-08 MED ORDER — PRENATAL MULTIVITAMIN CH
1.0000 | ORAL_TABLET | Freq: Every day | ORAL | Status: DC
  Administered 2011-10-09: 1 via ORAL
  Filled 2011-10-08: qty 1

## 2011-10-08 MED ORDER — DIPHENHYDRAMINE HCL 50 MG/ML IJ SOLN: 25.0000 mg | INTRAMUSCULAR | Status: DC | PRN

## 2011-10-08 MED ORDER — CITRIC ACID-SODIUM CITRATE 334-500 MG/5ML PO SOLN
30.0000 mL | ORAL | Status: DC | PRN
  Administered 2011-10-08: 30 mL via ORAL
  Filled 2011-10-08: qty 15

## 2011-10-08 MED ORDER — NALOXONE HCL 0.4 MG/ML IJ SOLN: 0.4000 mg | INTRAMUSCULAR | Status: DC | PRN

## 2011-10-08 MED ORDER — SODIUM BICARBONATE 8.4 % IV SOLN
INTRAVENOUS | Status: DC | PRN
  Administered 2011-10-08: 3 mL via EPIDURAL

## 2011-10-08 MED ORDER — ACETAMINOPHEN 325 MG PO TABS: 325.0000 mg | ORAL_TABLET | ORAL | Status: DC | PRN

## 2011-10-08 MED ORDER — NALBUPHINE HCL 10 MG/ML IJ SOLN: 5.0000 mg | INTRAMUSCULAR | Status: DC | PRN

## 2011-10-08 MED ORDER — FLEET ENEMA 7-19 GM/118ML RE ENEM: 1.0000 | ENEMA | RECTAL | Status: DC | PRN

## 2011-10-08 MED ORDER — METOCLOPRAMIDE HCL 5 MG/ML IJ SOLN: 10.0000 mg | Freq: Three times a day (TID) | INTRAMUSCULAR | Status: DC | PRN

## 2011-10-08 MED ORDER — SODIUM CHLORIDE 0.9 % IJ SOLN: 3.0000 mL | INTRAMUSCULAR | Status: DC | PRN

## 2011-10-08 MED ORDER — LACTATED RINGERS IV SOLN
INTRAVENOUS | Status: DC
  Administered 2011-10-08 (×2): via INTRAVENOUS

## 2011-10-08 MED ORDER — KETOROLAC TROMETHAMINE 30 MG/ML IJ SOLN: 30.0000 mg | Freq: Four times a day (QID) | INTRAMUSCULAR | Status: AC | PRN

## 2011-10-08 MED ORDER — BENZOCAINE-MENTHOL 20-0.5 % EX AERO: 1.0000 "application " | INHALATION_SPRAY | CUTANEOUS | Status: DC | PRN

## 2011-10-08 MED ORDER — FENTANYL 2.5 MCG/ML BUPIVACAINE 1/10 % EPIDURAL INFUSION (WH - ANES)
14.0000 mL/h | INTRAMUSCULAR | Status: DC
  Administered 2011-10-08 (×2): 14 mL/h via EPIDURAL
  Filled 2011-10-08 (×2): qty 60

## 2011-10-08 MED ORDER — FENTANYL CITRATE 0.05 MG/ML IJ SOLN
INTRAMUSCULAR | Status: AC
  Filled 2011-10-08: qty 2

## 2011-10-08 MED ORDER — SCOPOLAMINE 1 MG/3DAYS TD PT72: 1.0000 | MEDICATED_PATCH | Freq: Once | TRANSDERMAL | Status: DC

## 2011-10-08 MED ORDER — ONDANSETRON HCL 4 MG/2ML IJ SOLN: 4.0000 mg | Freq: Three times a day (TID) | INTRAMUSCULAR | Status: DC | PRN

## 2011-10-08 MED ORDER — EPHEDRINE 5 MG/ML INJ
10.0000 mg | INTRAVENOUS | Status: DC | PRN
  Filled 2011-10-08 (×2): qty 4

## 2011-10-08 MED ORDER — FENTANYL CITRATE 0.05 MG/ML IJ SOLN
INTRAMUSCULAR | Status: DC | PRN
  Administered 2011-10-08 (×2): 50 ug via INTRAVENOUS
  Administered 2011-10-08: 100 ug via INTRAVENOUS

## 2011-10-08 MED ORDER — MIDAZOLAM HCL 2 MG/2ML IJ SOLN
INTRAMUSCULAR | Status: AC
  Filled 2011-10-08: qty 2

## 2011-10-08 MED ORDER — ONDANSETRON HCL 4 MG/2ML IJ SOLN: 4.0000 mg | Freq: Four times a day (QID) | INTRAMUSCULAR | Status: DC | PRN

## 2011-10-08 MED ORDER — FENTANYL CITRATE 0.05 MG/ML IJ SOLN: 25.0000 ug | INTRAMUSCULAR | Status: DC | PRN

## 2011-10-08 MED ORDER — ONDANSETRON HCL 4 MG/2ML IJ SOLN
INTRAMUSCULAR | Status: AC
  Filled 2011-10-08: qty 2

## 2011-10-08 MED ORDER — SODIUM BICARBONATE 8.4 % IV SOLN
INTRAVENOUS | Status: AC
  Filled 2011-10-08: qty 50

## 2011-10-08 MED ORDER — ONDANSETRON HCL 4 MG/2ML IJ SOLN: 4.0000 mg | INTRAMUSCULAR | Status: DC | PRN

## 2011-10-08 MED ORDER — TETANUS-DIPHTH-ACELL PERTUSSIS 5-2.5-18.5 LF-MCG/0.5 IM SUSP
0.5000 mL | Freq: Once | INTRAMUSCULAR | Status: AC
  Administered 2011-10-09: 0.5 mL via INTRAMUSCULAR
  Filled 2011-10-08: qty 0.5

## 2011-10-08 MED ORDER — DIPHENHYDRAMINE HCL 25 MG PO CAPS: 25.0000 mg | ORAL_CAPSULE | Freq: Four times a day (QID) | ORAL | Status: DC | PRN

## 2011-10-08 MED ORDER — LANOLIN HYDROUS EX OINT: TOPICAL_OINTMENT | CUTANEOUS | Status: DC | PRN

## 2011-10-08 MED ORDER — LACTATED RINGERS IV SOLN: INTRAVENOUS | Status: DC

## 2011-10-08 MED ORDER — MIDAZOLAM HCL 5 MG/5ML IJ SOLN
INTRAMUSCULAR | Status: DC | PRN
  Administered 2011-10-08: 2 mg via INTRAVENOUS
  Administered 2011-10-08: 1 mg via INTRAVENOUS

## 2011-10-08 MED ORDER — LACTATED RINGERS IV SOLN: 500.0000 mL | Freq: Once | INTRAVENOUS | Status: DC

## 2011-10-08 MED ORDER — ACETAMINOPHEN 10 MG/ML IV SOLN: 1000.0000 mg | Freq: Four times a day (QID) | INTRAVENOUS | Status: AC | PRN

## 2011-10-08 MED ORDER — SENNOSIDES-DOCUSATE SODIUM 8.6-50 MG PO TABS
2.0000 | ORAL_TABLET | Freq: Every day | ORAL | Status: DC
  Administered 2011-10-08 – 2011-10-09 (×2): 2 via ORAL

## 2011-10-08 MED ORDER — ONDANSETRON HCL 4 MG PO TABS: 4.0000 mg | ORAL_TABLET | ORAL | Status: DC | PRN

## 2011-10-08 MED ORDER — KETOROLAC TROMETHAMINE 60 MG/2ML IM SOLN
INTRAMUSCULAR | Status: DC | PRN
  Administered 2011-10-08: 30 mg via INTRAMUSCULAR

## 2011-10-08 MED ORDER — MEPERIDINE HCL 25 MG/ML IJ SOLN: 6.2500 mg | INTRAMUSCULAR | Status: DC | PRN

## 2011-10-08 MED ORDER — SODIUM CHLORIDE 0.9 % IV SOLN: 250.0000 mL | INTRAVENOUS | Status: DC | PRN

## 2011-10-08 MED ORDER — OXYTOCIN BOLUS FROM INFUSION
500.0000 mL | Freq: Once | INTRAVENOUS | Status: DC
  Filled 2011-10-08: qty 500
  Filled 2011-10-08: qty 1000

## 2011-10-08 MED ORDER — OXYCODONE-ACETAMINOPHEN 5-325 MG PO TABS: 2.0000 | ORAL_TABLET | ORAL | Status: DC | PRN

## 2011-10-08 MED ORDER — IBUPROFEN 600 MG PO TABS
600.0000 mg | ORAL_TABLET | Freq: Four times a day (QID) | ORAL | Status: DC
  Administered 2011-10-08 – 2011-10-10 (×6): 600 mg via ORAL
  Filled 2011-10-08 (×6): qty 1

## 2011-10-08 MED ORDER — ZOLPIDEM TARTRATE 5 MG PO TABS: 5.0000 mg | ORAL_TABLET | Freq: Every evening | ORAL | Status: DC | PRN

## 2011-10-08 MED ORDER — SIMETHICONE 80 MG PO CHEW: 80.0000 mg | CHEWABLE_TABLET | ORAL | Status: DC | PRN

## 2011-10-08 MED ORDER — KETOROLAC TROMETHAMINE 30 MG/ML IJ SOLN
INTRAMUSCULAR | Status: DC | PRN
  Administered 2011-10-08: 30 mg via INTRAVENOUS

## 2011-10-08 MED ORDER — BUPIVACAINE HCL (PF) 0.25 % IJ SOLN
INTRAMUSCULAR | Status: DC | PRN
  Administered 2011-10-08: 8 mL

## 2011-10-08 MED ORDER — WITCH HAZEL-GLYCERIN EX PADS: 1.0000 "application " | MEDICATED_PAD | CUTANEOUS | Status: DC | PRN

## 2011-10-08 MED ORDER — ACETAMINOPHEN 325 MG PO TABS: 650.0000 mg | ORAL_TABLET | ORAL | Status: DC | PRN

## 2011-10-08 MED ORDER — NALBUPHINE HCL 10 MG/ML IJ SOLN
5.0000 mg | INTRAMUSCULAR | Status: DC | PRN
  Filled 2011-10-08: qty 1

## 2011-10-08 MED ORDER — SODIUM CHLORIDE 0.9 % IJ SOLN: 3.0000 mL | Freq: Two times a day (BID) | INTRAMUSCULAR | Status: DC

## 2011-10-08 MED ORDER — OXYCODONE-ACETAMINOPHEN 5-325 MG PO TABS
1.0000 | ORAL_TABLET | ORAL | Status: DC | PRN
  Administered 2011-10-09 (×3): 1 via ORAL
  Administered 2011-10-09: 2 via ORAL
  Filled 2011-10-08 (×3): qty 1
  Filled 2011-10-08: qty 2

## 2011-10-08 MED ORDER — PHENYLEPHRINE 40 MCG/ML (10ML) SYRINGE FOR IV PUSH (FOR BLOOD PRESSURE SUPPORT)
80.0000 ug | PREFILLED_SYRINGE | INTRAVENOUS | Status: DC | PRN
  Filled 2011-10-08 (×2): qty 5

## 2011-10-08 MED ORDER — DIBUCAINE 1 % RE OINT: 1.0000 "application " | TOPICAL_OINTMENT | RECTAL | Status: DC | PRN

## 2011-10-08 MED ORDER — LACTATED RINGERS IV SOLN
INTRAVENOUS | Status: DC
Start: 2011-10-08 — End: 2011-10-08
  Administered 2011-10-08: 125 mL/h via INTRAVENOUS

## 2011-10-08 MED ORDER — SODIUM CHLORIDE 0.9 % IV SOLN: 1.0000 ug/kg/h | INTRAVENOUS | Status: DC | PRN

## 2011-10-08 MED ORDER — LACTATED RINGERS IV SOLN
500.0000 mL | INTRAVENOUS | Status: DC | PRN
Start: 2011-10-08 — End: 2011-10-08
  Administered 2011-10-08: 1000 mL via INTRAVENOUS

## 2011-10-08 MED ORDER — DIPHENHYDRAMINE HCL 25 MG PO CAPS: 25.0000 mg | ORAL_CAPSULE | ORAL | Status: DC | PRN

## 2011-10-08 MED ORDER — ONDANSETRON HCL 4 MG/2ML IJ SOLN
INTRAMUSCULAR | Status: DC | PRN
  Administered 2011-10-08: 4 mg via INTRAVENOUS

## 2011-10-08 MED ORDER — EPHEDRINE 5 MG/ML INJ
10.0000 mg | INTRAVENOUS | Status: DC | PRN
  Filled 2011-10-08: qty 4

## 2011-10-08 MED ORDER — OXYTOCIN 20 UNITS IN LACTATED RINGERS INFUSION - SIMPLE
125.0000 mL/h | Freq: Once | INTRAVENOUS | Status: AC
  Administered 2011-10-08: 125 mL/h via INTRAVENOUS

## 2011-10-08 MED ORDER — LACTATED RINGERS IV SOLN
INTRAVENOUS | Status: DC | PRN
  Administered 2011-10-08 (×2): via INTRAVENOUS

## 2011-10-08 MED ORDER — LIDOCAINE HCL (PF) 1 % IJ SOLN
30.0000 mL | INTRAMUSCULAR | Status: DC | PRN
  Filled 2011-10-08 (×2): qty 30

## 2011-10-08 MED ORDER — PHENYLEPHRINE 40 MCG/ML (10ML) SYRINGE FOR IV PUSH (FOR BLOOD PRESSURE SUPPORT)
80.0000 ug | PREFILLED_SYRINGE | INTRAVENOUS | Status: DC | PRN
  Filled 2011-10-08: qty 5

## 2011-10-08 MED ORDER — LIDOCAINE-EPINEPHRINE (PF) 2 %-1:200000 IJ SOLN
INTRAMUSCULAR | Status: AC
  Filled 2011-10-08: qty 20

## 2011-10-08 SURGICAL SUPPLY — 24 items
CHLORAPREP W/TINT 26ML (MISCELLANEOUS) ×2 IMPLANT
CLIP FILSHIE TUBAL LIGA STRL (Clip) ×1 IMPLANT
CONTAINER PREFILL 10% NBF 15ML (MISCELLANEOUS) ×2 IMPLANT
DRSG COVADERM PLUS 2X2 (GAUZE/BANDAGES/DRESSINGS) ×1 IMPLANT
ELECT REM PT RETURN 9FT ADLT (ELECTROSURGICAL) ×2
ELECTRODE REM PT RTRN 9FT ADLT (ELECTROSURGICAL) IMPLANT
GLOVE BIOGEL PI IND STRL 6.5 (GLOVE) ×2 IMPLANT
GLOVE BIOGEL PI INDICATOR 6.5 (GLOVE) ×2
GLOVE SURG SS PI 6.0 STRL IVOR (GLOVE) ×2 IMPLANT
GOWN PREVENTION PLUS LG XLONG (DISPOSABLE) ×4 IMPLANT
NDL HYPO 25X1 1.5 SAFETY (NEEDLE) IMPLANT
NEEDLE HYPO 25X1 1.5 SAFETY (NEEDLE) ×2 IMPLANT
NS IRRIG 1000ML POUR BTL (IV SOLUTION) ×2 IMPLANT
PACK ABDOMINAL MINOR (CUSTOM PROCEDURE TRAY) ×2 IMPLANT
PENCIL BUTTON HOLSTER BLD 10FT (ELECTRODE) ×1 IMPLANT
SPONGE LAP 4X18 X RAY DECT (DISPOSABLE) ×1 IMPLANT
SUT PLAIN 0 NONE (SUTURE) ×2 IMPLANT
SUT VIC AB 0 CT1 27 (SUTURE) ×2
SUT VIC AB 0 CT1 27XBRD ANBCTR (SUTURE) ×1 IMPLANT
SUT VIC AB 3-0 PS2 18 (SUTURE) ×2 IMPLANT
SYR CONTROL 10ML LL (SYRINGE) ×1 IMPLANT
TOWEL OR 17X24 6PK STRL BLUE (TOWEL DISPOSABLE) ×4 IMPLANT
TRAY FOLEY CATH 14FR (SET/KITS/TRAYS/PACK) ×2 IMPLANT
WATER STERILE IRR 1000ML POUR (IV SOLUTION) ×1 IMPLANT

## 2011-10-08 NOTE — ED Provider Notes (Signed)
36 yo G4P1021 at [redacted]w[redacted]d presenting in active labor. See admission H&P.

## 2011-10-08 NOTE — Anesthesia Preprocedure Evaluation (Addendum)
Anesthesia Evaluation  Patient identified by MRN, date of birth, ID band Patient awake    Reviewed: Allergy & Precautions, H&P , Patient's Chart, lab work & pertinent test results  Airway Mallampati: II TM Distance: >3 FB Neck ROM: full    Dental No notable dental hx.    Pulmonary neg pulmonary ROS, Current Smoker,  clear to auscultation  Pulmonary exam normal       Cardiovascular neg cardio ROS regular Normal    Neuro/Psych Negative Neurological ROS  Negative Psych ROS   GI/Hepatic negative GI ROS, Neg liver ROS,   Endo/Other  Negative Endocrine ROS  Renal/GU negative Renal ROS     Musculoskeletal   Abdominal   Peds  Hematology negative hematology ROS (+)   Anesthesia Other Findings   Reproductive/Obstetrics (+) Pregnancy                          Anesthesia Physical Anesthesia Plan  ASA: II  Anesthesia Plan: Epidural   Post-op Pain Management:    Induction:   Airway Management Planned:   Additional Equipment:   Intra-op Plan:   Post-operative Plan:   Informed Consent: I have reviewed the patients History and Physical, chart, labs and discussed the procedure including the risks, benefits and alternatives for the proposed anesthesia with the patient or authorized representative who has indicated his/her understanding and acceptance.     Plan Discussed with:   Anesthesia Plan Comments:         Anesthesia Quick Evaluation

## 2011-10-08 NOTE — Op Note (Addendum)
PREOPERATIVE DIAGNOSIS:  Multiparity, undesired fertility  POSTOPERATIVE DIAGNOSIS:  Multiparity, undesired fertility  PROCEDURE:  Postpartum Bilateral Tubal Sterilization using Filshie Clips   ANESTHESIA:  Epidural and local analgesia using 0.25% Marcaine  COMPLICATIONS:  None immediate.  ESTIMATED BLOOD LOSS: 5 ml.  FLUIDS: 1200 ml LR.  URINE OUTPUT:  150 ml of clear urine.  INDICATIONS: 36 yo [id number] with undesired fertility,status post vaginal delivery, desires permanent sterilization.  Other reversible forms of contraception were discussed with patient; she declines all other modalities. Risks of procedure discussed with patient including but not limited to: risk of regret, permanence of method, bleeding, infection, injury to surrounding organs and need for additional procedures.  Failure risk of 0.5-1% with increased risk of ectopic gestation if pregnancy occurs was also discussed with patient.     FINDINGS:  Normal uterus, tubes, and ovaries.  PROCEDURE DETAILS: The patient was taken to the operating room where her epidural anesthesia was dosed up to surgical level and found to be adequate.  She was then placed in the dorsal supine position and prepped and draped in sterile fashion.  After an adequate timeout was performed, attention was turned to the patient's abdomen where a small transverse skin incision was made under the umbilical fold. The incision was taken down to the layer of fascia using the scalpel, and fascia was incised, and extended bilaterally using Mayo scissors. The peritoneum was entered in a sharp fashion. Attention was then turned to the patient's uterus, where omental adhesions were noted on the fundus. The left fallopian tube was identified and followed out to the fimbriated end.  A Filshie clip was placed on the left fallopian tube about 2 cm from the cornual attachment, with care given to incorporate the underlying mesosalpinx.  A similar process was carried out on  the right side allowing for bilateral tubal sterilization.  Good hemostasis was noted overall.  Local analgesia was injected into both Filshie application sites.The instruments were then removed from the patient's abdomen and the fascial incision was repaired with 0 Vicryl, and the skin was closed with a 4-0 Vicryl subcuticular stitch. The patient tolerated the procedure well.  Instrument, sponge, and needle counts were correct times two.  The patient was then taken to the recovery room awake and in stable condition.  Catalina Antigua, MD 10/08/2011 4:59pm

## 2011-10-08 NOTE — Anesthesia Postprocedure Evaluation (Signed)
Anesthesia Post Note  Patient: Kristin [name]  Procedure(s) Performed:  POST PARTUM TUBAL LIGATION  Anesthesia type: Epidural  Patient location:pacu  Post pain: Pain level controlled  Post assessment: Post-op Vital signs reviewed  Last Vitals:  Filed Vitals:   10/08/11 1730  BP: 117/67  Pulse: 89  Temp:   Resp: 23    Post vital signs: Reviewed  Level of consciousness: awake  Complications: No apparent anesthesia complications

## 2011-10-08 NOTE — Transfer of Care (Signed)
Immediate Anesthesia Transfer of Care Note  Patient: Kristin [name]  Procedure(s) Performed:  POST PARTUM TUBAL LIGATION  Patient Location: PACU  Anesthesia Type: Epidural  Level of Consciousness: awake, alert  and oriented  Airway & Oxygen Therapy: Patient Spontanous Breathing  Post-op Assessment: Report given to PACU RN and Post -op Vital signs reviewed and stable  Post vital signs: Reviewed and stable  Complications: No apparent anesthesia complications

## 2011-10-08 NOTE — Progress Notes (Signed)
Pt may go to room 164. 

## 2011-10-08 NOTE — Progress Notes (Signed)
Pt G2 P1 at 40.6wks having contractions every 5 min.  Denies bleeding or problems with pregnancy.

## 2011-10-08 NOTE — Anesthesia Procedure Notes (Signed)
Epidural Patient location during procedure: OB Start time: 10/08/2011 7:53 AM  Staffing Anesthesiologist: Brayton Caves R Performed by: anesthesiologist   Preanesthetic Checklist Completed: patient identified, site marked, surgical consent, pre-op evaluation, timeout performed, IV checked, risks and benefits discussed and monitors and equipment checked  Epidural Patient position: sitting Prep: site prepped and draped and DuraPrep Patient monitoring: continuous pulse ox and blood pressure Approach: midline Injection technique: LOR air and LOR saline  Needle:  Needle type: Tuohy  Needle gauge: 17 G Needle length: 9 cm Needle insertion depth: 5 cm cm Catheter type: closed end flexible Catheter size: 19 Gauge Catheter at skin depth: 10 cm Test dose: negative  Assessment Events: blood not aspirated, injection not painful, no injection resistance, negative IV test and no paresthesia  Additional Notes Patient identified.  Risk benefits discussed including failed block, incomplete pain control, headache, nerve damage, paralysis, blood pressure changes, nausea, vomiting, reactions to medication both toxic or allergic, and postpartum back pain.  Patient expressed understanding and wished to proceed.  All questions were answered.  Sterile technique used throughout procedure and epidural site dressed with sterile barrier dressing. No paresthesia or other complications noted.The patient did not experience any signs of intravascular injection such as tinnitus or metallic taste in mouth nor signs of intrathecal spread such as rapid motor block. Please see nursing notes for vital signs.

## 2011-10-08 NOTE — Progress Notes (Signed)
Kristin [name] is a 36 y.o. G4P1021 at [redacted]w[redacted]d by ultrasound admitted for active labor  Subjective: Doing well. Feels a little pressure, but mostly comfortable  Objective: BP 124/66  Pulse 88  Temp(Src) 98.2 F (36.8 C) (Oral)  Resp 18  Ht 5\' 3"  (1.6 m)  Wt 72.303 kg (159 lb 6.4 oz)  BMI 28.24 kg/m2  SpO2 100%      FHT:  FHR: 135 bpm, variability: moderate,  accelerations:  Present,  decelerations:  Absent UC:   regular, every 3 minutes SVE:   Dilation: 8 Effacement (%): 100 Station: -1 Exam by:: m wilkins rnc  Labs: Lab Results  Component Value Date   WBC 11.7* 10/08/2011   HGB 12.7 10/08/2011   HCT 36.5 10/08/2011   MCV 91.7 10/08/2011   PLT 126* 10/08/2011    Assessment / Plan: Spontaneous labor, progressing normally  Labor: Progressing normally Preeclampsia:  n/a Fetal Wellbeing:  Category I Pain Control:  Epidural I/D:  n/a Anticipated MOD:  NSVD  WILLIAMS,MARIE 10/08/2011, 9:29 AM

## 2011-10-08 NOTE — Progress Notes (Signed)
Patient ID: Kristin [name], female   DOB: 18-Jun-1976, 36 y.o.   MRN: [id number]  See delivery note for Normal spontaneous vaginal delivery of female infant.

## 2011-10-08 NOTE — Progress Notes (Signed)
Patient ID: Kristin [name], female   DOB: 08/30/1976, 36 y.o.   MRN: [id number]   36 y.o. yo [phone]  with undesired fertility,status post vaginal delivery, desires permanent sterilization. Risks and benefits of procedure discussed with patient including permanence of method, bleeding, infection, injury to surrounding organs and need for additional procedures. Risk failure of 0.5-1% with increased risk of ectopic gestation if pregnancy occurs was also discussed with patient.  Patient understands that there is a very low likelihood that she will be able to achieve pregnancy following this procedure

## 2011-10-08 NOTE — H&P (Signed)
Kristin [name] is a 36 y.o. female presenting for labor at term. Plans PPS.. Maternal Medical History:  Reason for admission: Reason for admission: contractions.  Reason for Admission:   nauseaContractions: Onset was 6-12 hours ago.   Frequency: regular.   Perceived severity is strong.    Fetal activity: Perceived fetal activity is normal.   Last perceived fetal movement was within the past hour.    Prenatal complications: no prenatal complications Prenatal Complications - Diabetes: none.    OB History    Grav Para Term Preterm Abortions TAB SAB Ect Mult Living   4 1 1  2   2  1      Past Medical History  Diagnosis Date  . UTI (lower urinary tract infection)   . Anemia    Past Surgical History  Procedure Date  . Ectopic pregnancy surgery   . Wisdom tooth extraction    Family History: family history is not on file. Social History:  reports that she has been smoking Cigarettes.  She has been smoking about 1 pack per day. She has never used smokeless tobacco. She reports that she does not drink alcohol or use illicit drugs.  Review of Systems  Constitutional: Negative for fever.  Respiratory: Negative for cough.   Gastrointestinal: Negative for nausea and vomiting.  Genitourinary: Negative for dysuria.  Skin: Negative for rash.  Neurological: Positive for headaches.  Psychiatric/Behavioral: Negative for depression.    Dilation: 5.5 Effacement (%): 90 Station: -2 Exam by:: Humphrey Rolls, RN Blood pressure 131/64, pulse 96, temperature 98.2 F (36.8 C), temperature source Oral, resp. rate 18, height 5\' 3"  (1.6 m), weight 72.303 kg (159 lb 6.4 oz). Maternal Exam:  Uterine Assessment: Contraction strength is firm.  Contraction frequency is regular.   Abdomen: Estimated fetal weight is  7#.   Fetal presentation: vertex  Introitus: Normal vulva. Normal vagina.  Pelvis: adequate for delivery.   Cervix: Cervix evaluated by digital exam.     Fetal Exam Fetal Monitor  Review: Mode: ultrasound.   Baseline rate: 135.  Variability: moderate (6-25 bpm).   Pattern: accelerations present and no decelerations.    Fetal State Assessment: Category I - tracings are normal.     Physical Exam  Constitutional: She is oriented to person, place, and time. She appears well-developed and well-nourished. She appears distressed.  HENT:  Head: Normocephalic.  Eyes: EOM are normal.  Neck: Neck supple.  Cardiovascular: Normal rate, regular rhythm and normal heart sounds.   Respiratory: Effort normal and breath sounds normal.  GI: Soft. There is no tenderness.  Genitourinary: Vagina normal.       VE per RN:5/90/-2 vtx intact  Musculoskeletal: Normal range of motion.  Neurological: She is alert and oriented to person, place, and time. She has normal reflexes.  Skin: Skin is warm and dry.  Psychiatric: She has a normal mood and affect.    Prenatal labs: ABO, Rh: O/Positive/-- (12/27 0000) Antibody:   Rubella: Immune (12/27 0000) RPR: Nonreactive (12/27 0000)  HBsAg: Negative (12/27 0000)  HIV: Non-reactive (12/27 0000)  GBS: Negative (12/04 0000)   Assessment/Plan: 36 yo G4P1021 at [redacted]w[redacted]d in active labor Cat 1 FHR Smoker Proceed with Epidural Expect SVD   Hesston Hitchens 10/08/2011, 7:35 AM

## 2011-10-08 NOTE — Anesthesia Preprocedure Evaluation (Signed)
Anesthesia Evaluation  Patient identified by MRN, date of birth, ID band Patient awake    Reviewed: Allergy & Precautions, H&P , Patient's Chart, lab work & pertinent test results  Airway Mallampati: II TM Distance: >3 FB Neck ROM: full    Dental No notable dental hx.    Pulmonary neg pulmonary ROS, Current Smoker,  clear to auscultation  Pulmonary exam normal       Cardiovascular neg cardio ROS regular Normal    Neuro/Psych Negative Neurological ROS  Negative Psych ROS   GI/Hepatic negative GI ROS, Neg liver ROS,   Endo/Other  Negative Endocrine ROS  Renal/GU negative Renal ROS     Musculoskeletal   Abdominal   Peds  Hematology negative hematology ROS (+)   Anesthesia Other Findings   Reproductive/Obstetrics                           Anesthesia Physical  Anesthesia Plan  ASA: II  Anesthesia Plan: Epidural   Post-op Pain Management:    Induction:   Airway Management Planned:   Additional Equipment:   Intra-op Plan:   Post-operative Plan:   Informed Consent: I have reviewed the patients History and Physical, chart, labs and discussed the procedure including the risks, benefits and alternatives for the proposed anesthesia with the patient or authorized representative who has indicated his/her understanding and acceptance.     Plan Discussed with:   Anesthesia Plan Comments:         Anesthesia Quick Evaluation

## 2011-10-09 ENCOUNTER — Encounter (HOSPITAL_COMMUNITY): Payer: Self-pay | Admitting: Obstetrics and Gynecology

## 2011-10-09 DIAGNOSIS — Z9851 Tubal ligation status: Secondary | ICD-10-CM

## 2011-10-09 MED ORDER — IBUPROFEN 600 MG PO TABS: 600.0000 mg | ORAL_TABLET | Freq: Four times a day (QID) | ORAL | Status: AC

## 2011-10-09 MED ORDER — OXYCODONE-ACETAMINOPHEN 5-325 MG PO TABS: 1.0000 | ORAL_TABLET | ORAL | Status: AC | PRN

## 2011-10-09 NOTE — Anesthesia Postprocedure Evaluation (Signed)
  Anesthesia Post-op Note  Patient: Kristin [name]  Procedure(s) Performed:  POST PARTUM TUBAL LIGATION  Patient Location: mother/baby  Anesthesia Type: Epidural  Level of Consciousness: awake, alert  and oriented  Airway and Oxygen Therapy: Patient Spontanous Breathing  Post-op Pain: none  Post-op Assessment: Post-op Vital signs reviewed, Patient's Cardiovascular Status Stable, No headache, No backache, No residual numbness and No residual motor weakness  Post-op Vital Signs: Reviewed and stable  Complications: No apparent anesthesia complications

## 2011-10-09 NOTE — Addendum Note (Signed)
Addendum  created 10/09/11 0757 by Madison Hickman   Modules edited:Notes Section

## 2011-10-09 NOTE — Progress Notes (Signed)
UR chart review completed.  

## 2011-10-09 NOTE — Discharge Summary (Signed)
Obstetric Discharge Summary Reason for Admission: onset of labor Prenatal Procedures: NST Intrapartum Procedures: spontaneous vaginal delivery Postpartum Procedures: P.P. tubal ligation Complications-Operative and Postpartum: none Hemoglobin  Date Value Range Status  10/08/2011 12.7  12.0-15.0 (g/dL) Final     HCT  Date Value Range Status  10/08/2011 36.5  36.0-46.0 (%) Final   Hospital Course:  Presented in labor and progressed to vaginal delivery with no complications. Had requested BTL and signed papers. This was done the afternoon after delivery. There were no complications. She requested early discharge.   Discharge Diagnoses: Term Pregnancy-delivered and Undesired Fertility, S/P BTL  Discharge Information: Date: 10/09/2011 Activity: unrestricted and pelvic rest Diet: routine Medications: PNV, Ibuprofen and Percocet Condition: stable Instructions: refer to practice specific booklet Discharge to: home Follow up with [facility] in 4 weeks.  Newborn Data: Live born female  Birth Weight: 6 lb 13.9 oz (3116 g) APGAR: 9, 9  Home with mother.  Wynelle Bourgeois 10/09/2011, 7:12 AM

## 2011-10-09 NOTE — Discharge Summary (Signed)
Attestation of Attending Supervision of Advanced Practitioner: Evaluation and management procedures were performed by the PA/NP/CNM/OB Fellow under my supervision/collaboration. Chart reviewed and agree with management and plan.  Cristopher Ciccarelli V 10/09/2011 9:59 AM

## 2011-10-10 NOTE — Progress Notes (Signed)
Post Partum Day 2, Post-op BTL Day 2  Subjective: no complaints, up ad lib, voiding, tolerating PO and + flatus  Objective: Blood pressure 113/72, pulse 69, temperature 97.6 F (36.4 C), temperature source Oral, resp. rate 18, height 5\' 3"  (1.6 m), weight 72.303 kg (159 lb 6.4 oz), SpO2 98.00%, not currently breastfeeding.  Physical Exam:  General: alert, cooperative and no distress Lochia: appropriate Uterine Fundus: firm Incision: healing well, no significant drainage, dressing removed DVT Evaluation: No evidence of DVT seen on physical exam. Negative Homan's sign. No cords or calf tenderness. No significant calf/ankle edema.   Basename 10/08/11 0655  HGB 12.7  HCT 36.5    Assessment/Plan: Discharge home and Contraception S/P BTL, Circ @ [facility] Has already called for 5wk pp visit, to call for circ appointment   LOS: 2 days   Joellyn Haff, SNM 10/10/2011, 7:22 AM

## 2011-10-10 NOTE — Discharge Summary (Signed)
Obstetric Discharge Summary Reason for Admission: onset of labor Prenatal Procedures: ultrasound Intrapartum Procedures: spontaneous vaginal delivery Postpartum Procedures: P.P. tubal ligation Complications-Operative and Postpartum: 1st degree perineal laceration with repair Hemoglobin  Date Value Range Status  10/08/2011 12.7  12.0-15.0 (g/dL) Final     HCT  Date Value Range Status  10/08/2011 36.5  36.0-46.0 (%) Final    Discharge Diagnoses: Term Pregnancy-delivered, S/P PP BTL  Discharge Information: Date: 10/10/2011 Activity: pelvic rest Diet: routine Medications: Ibuprofen Condition: stable Instructions: refer to practice specific booklet Discharge to: home Follow-up Information    Follow up with Tilda Burrow, MD. Make an appointment in 5 weeks.   Contact information:   [facility] [address] [place] [address] [phone]          Newborn Data: Live born female  Birth Weight: 6 lb 13.9 oz (3116 g) APGAR: 9, 9  Home with mother.  Joellyn Haff, SNM 10/10/2011, 7:27 AM

## 2011-10-11 NOTE — Anesthesia Postprocedure Evaluation (Signed)
Patient stable following vaginal delivery.  

## 2012-01-15 ENCOUNTER — Emergency Department (HOSPITAL_COMMUNITY): Payer: Self-pay

## 2012-01-15 ENCOUNTER — Emergency Department (HOSPITAL_COMMUNITY)
Admission: EM | Admit: 2012-01-15 | Discharge: 2012-01-16 | Disposition: A | Discharge status: Home / Self Care | Payer: Self-pay | Attending: Emergency Medicine | Admitting: Emergency Medicine

## 2012-01-15 ENCOUNTER — Encounter (HOSPITAL_COMMUNITY): Payer: Self-pay | Admitting: Emergency Medicine

## 2012-01-15 DIAGNOSIS — R0789 Other chest pain: Secondary | ICD-10-CM | POA: Insufficient documentation

## 2012-01-15 DIAGNOSIS — R110 Nausea: Secondary | ICD-10-CM | POA: Insufficient documentation

## 2012-01-15 DIAGNOSIS — R5381 Other malaise: Secondary | ICD-10-CM | POA: Insufficient documentation

## 2012-01-15 DIAGNOSIS — Z0000 Encounter for general adult medical examination without abnormal findings: Secondary | ICD-10-CM

## 2012-01-15 DIAGNOSIS — F17200 Nicotine dependence, unspecified, uncomplicated: Secondary | ICD-10-CM | POA: Insufficient documentation

## 2012-01-15 LAB — CBC
HCT: 40.5 % (ref 36.0–46.0)
Hemoglobin: 13.6 g/dL (ref 12.0–15.0)
MCH: 29.8 pg (ref 26.0–34.0)
MCHC: 33.6 g/dL (ref 30.0–36.0)
MCV: 88.6 fL (ref 78.0–100.0)
RDW: 13.4 % (ref 11.5–15.5)

## 2012-01-15 LAB — DIFFERENTIAL
Basophils Relative: 0 % (ref 0–1)
Eosinophils Absolute: 0.2 10*3/uL (ref 0.0–0.7)
Eosinophils Relative: 3 % (ref 0–5)
Monocytes Absolute: 0.8 10*3/uL (ref 0.1–1.0)
Monocytes Relative: 10 % (ref 3–12)
Neutro Abs: 4.8 10*3/uL (ref 1.7–7.7)

## 2012-01-15 LAB — BASIC METABOLIC PANEL
BUN: 12 mg/dL (ref 6–23)
Calcium: 9.4 mg/dL (ref 8.4–10.5)
Chloride: 106 mEq/L (ref 96–112)
Creatinine, Ser: 0.62 mg/dL (ref 0.50–1.10)
GFR calc Af Amer: >90 mL/min (ref >90)

## 2012-01-15 NOTE — ED Notes (Signed)
Patient c/o left arm pain and states she has felt jittery all day today.  Patient states the pain comes and goes.

## 2012-01-15 NOTE — ED Provider Notes (Signed)
History     CSN: [id number]  Arrival date & time 01/15/12  2043   First MD Initiated Contact with Patient 01/15/12 2157      Chief Complaint  Patient presents with  . Extremity Weakness    (Consider location/radiation/quality/duration/timing/severity/associated sxs/prior treatment) HPI Comments: Pt was helping her child with homework and started having 'DULL, SHOOTING, BRIEF DISCOMFORT" in her L arm.  She also had mild chest "tightness" and nausea lasting ~ 1 hrs.  She took a goodies powder.  She has no sxs at exam time.    The history is provided by the patient. No language interpreter was used.    Past Medical History  Diagnosis Date  . UTI (lower urinary tract infection)   . Anemia     Past Surgical History  Procedure Date  . Ectopic pregnancy surgery   . Wisdom tooth extraction   . Tubal ligation 10/08/2011    Procedure: POST PARTUM TUBAL LIGATION;  Surgeon: Catalina Antigua, MD;  Location: [facility];  Service: Gynecology;  Laterality: Bilateral;    No family history on file.  History  Substance Use Topics  . Smoking status: Current Everyday Smoker -- 1.0 packs/day    Types: Cigarettes  . Smokeless tobacco: Never Used  . Alcohol Use: No    OB History    Grav Para Term Preterm Abortions TAB SAB Ect Mult Living   4 2 2  2   2  2       Review of Systems  Respiratory: Positive for chest tightness. Negative for cough, shortness of breath, wheezing and stridor.   Cardiovascular: Negative for palpitations and leg swelling.  Gastrointestinal: Positive for nausea. Negative for vomiting.  Neurological: Negative for syncope and light-headedness.    Allergies  Review of patient's allergies indicates no known allergies.  Home Medications   Current Outpatient Rx  Name Route Sig Dispense Refill  . GOODY HEADACHE PO Oral Take 1 packet by mouth once as needed.      BP 140/80  Pulse 96  Temp(Src) 98.5 F (36.9 C) (Oral)  Resp 20  Ht 5\' 3"  (1.6 m)  Wt 133 lb (60.328  kg)  BMI 23.56 kg/m2  SpO2 100%  LMP 12/17/2011  Breastfeeding? No  Physical Exam  Nursing note and vitals reviewed. Constitutional: She is oriented to person, place, and time. She appears well-developed and well-nourished. No distress.  HENT:  Head: Normocephalic and atraumatic.  Eyes: EOM are normal.  Neck: Normal range of motion.  Cardiovascular: Normal rate, regular rhythm, S1 normal, S2 normal, normal heart sounds and normal pulses.   Pulmonary/Chest: Effort normal and breath sounds normal. No accessory muscle usage. Not tachypneic. No respiratory distress. She has no wheezes. She has no rhonchi. She has no rales. She exhibits no tenderness.  Abdominal: Soft. She exhibits no distension. There is no tenderness.  Musculoskeletal: Normal range of motion.  Neurological: She is alert and oriented to person, place, and time.  Skin: Skin is warm and dry.  Psychiatric: She has a normal mood and affect. Judgment normal.    ED Course  Procedures (including critical care time)   Labs Reviewed  CBC  DIFFERENTIAL  BASIC METABOLIC PANEL  TROPONIN I   Dg Chest 2 View  01/15/2012  *RADIOLOGY REPORT*  Clinical Data: Nausea.  Left arm weakness.  Smoker.  CHEST - 2 VIEW  Comparison: 11/02/2009.  Findings: Minimal peribronchial thickening. No infiltrate, congestive heart failure or pneumothorax.  Heart size within normal limits.  IMPRESSION: No acute  abnormality.  Original Report Authenticated By: Fuller Canada, M.D.     1. Normal physical exam      Date: 01/15/2012  Rate: 71  Rhythm: normal sinus rhythm  QRS Axis: normal  Intervals: normal  ST/T Wave abnormalities: normal  Conduction Disutrbances:none  Narrative Interpretation:   Old EKG Reviewed: none available    MDM  follow up with your MD        Worthy Rancher, PA 01/15/12 2325  Worthy Rancher, PA 01/16/12 0010

## 2012-01-16 NOTE — Discharge Instructions (Signed)
Normal Exam, Adult You were seen and examined today in our facility. Our caregiver found nothing wrong on the exam. If testing was done such as lab work or x-rays, they did not indicate enough wrong to suggest that treatment should be given. The caregiver then must decide after testing is finished if your concern is a physical problem or illness that needs treatment. Today no treatable problem was found. Even if reassurance was given, if you feel you are getting worse when you get home make sure you call back or return to our department. For the protection of your privacy, test results can not be given over the phone. Make sure you receive the results of your test. Ask as to how these results are to be obtained if you have not been informed. It is your responsibility to obtain your test results. Your condition can change over time. Sometimes it takes more than one visit to determine the cause of your symptoms. It is important that you monitor your condition for any changes. SEEK IMMEDIATE MEDICAL CARE IF:  You develop an oral temperature above 102 F (38.9 C), which lasts for more than 2 days, not controlled by medications. Only take over-the-counter or prescription medicines for pain, discomfort, or fever as directed by your caregiver.   You develop a loss of appetite or start throwing up (vomiting).   You develop a rash, cough, belly (abdominal) pain, earache, headache, or develop pain in neck, muscles, or joints.   The problem or problems which brought you to our facility become worse or are a cause of more concern.  If we have told you today your exam and tests are normal, and a short while later you feel this is not right, please seek medical attention so you may be rechecked. Document Released: 01/04/2001 Document Revised: 09/11/2011 Document Reviewed: 04/28/2008 [facility] Patient Information 2012 [place], [place].   Your EKG, chest x-ray and lab work is all normal.  Follow up with your MD.   He may want to do further testing.

## 2012-01-18 NOTE — ED Provider Notes (Signed)
Medical screening examination/treatment/procedure(s) were performed by non-physician practitioner and as supervising physician I was immediately available for consultation/collaboration.  Donnetta Hutching, MD 01/18/12 [phone]

## 2012-08-26 ENCOUNTER — Other Ambulatory Visit: Payer: Self-pay | Admitting: Obstetrics and Gynecology

## 2012-08-26 ENCOUNTER — Other Ambulatory Visit (HOSPITAL_COMMUNITY)
Admission: RE | Admit: 2012-08-26 | Discharge: 2012-08-26 | Disposition: A | Discharge status: Home / Self Care | Payer: BC Managed Care – PPO | Source: Ambulatory Visit | Attending: Obstetrics and Gynecology | Admitting: Obstetrics and Gynecology

## 2012-08-26 DIAGNOSIS — Z01419 Encounter for gynecological examination (general) (routine) without abnormal findings: Secondary | ICD-10-CM | POA: Insufficient documentation

## 2012-10-15 ENCOUNTER — Other Ambulatory Visit (HOSPITAL_COMMUNITY): Payer: Self-pay | Admitting: Family Medicine

## 2012-10-15 ENCOUNTER — Ambulatory Visit (HOSPITAL_COMMUNITY)
Admission: RE | Admit: 2012-10-15 | Discharge: 2012-10-15 | Disposition: A | Discharge status: Home / Self Care | Payer: BC Managed Care – PPO | Source: Ambulatory Visit | Attending: Family Medicine | Admitting: Family Medicine

## 2012-10-15 DIAGNOSIS — R079 Chest pain, unspecified: Secondary | ICD-10-CM

## 2013-05-19 ENCOUNTER — Emergency Department (HOSPITAL_COMMUNITY)
Admission: EM | Admit: 2013-05-19 | Discharge: 2013-05-19 | Disposition: A | Discharge status: Home / Self Care | Payer: BC Managed Care – PPO | Attending: Emergency Medicine | Admitting: Emergency Medicine

## 2013-05-19 ENCOUNTER — Encounter (HOSPITAL_COMMUNITY): Payer: Self-pay | Admitting: Emergency Medicine

## 2013-05-19 ENCOUNTER — Other Ambulatory Visit: Payer: Self-pay

## 2013-05-19 ENCOUNTER — Emergency Department (HOSPITAL_COMMUNITY): Payer: BC Managed Care – PPO

## 2013-05-19 DIAGNOSIS — Z862 Personal history of diseases of the blood and blood-forming organs and certain disorders involving the immune mechanism: Secondary | ICD-10-CM | POA: Insufficient documentation

## 2013-05-19 DIAGNOSIS — R0989 Other specified symptoms and signs involving the circulatory and respiratory systems: Secondary | ICD-10-CM | POA: Insufficient documentation

## 2013-05-19 DIAGNOSIS — F17200 Nicotine dependence, unspecified, uncomplicated: Secondary | ICD-10-CM | POA: Insufficient documentation

## 2013-05-19 DIAGNOSIS — Z87440 Personal history of urinary (tract) infections: Secondary | ICD-10-CM | POA: Insufficient documentation

## 2013-05-19 DIAGNOSIS — R000 Tachycardia, unspecified: Secondary | ICD-10-CM | POA: Insufficient documentation

## 2013-05-19 DIAGNOSIS — J9801 Acute bronchospasm: Secondary | ICD-10-CM | POA: Insufficient documentation

## 2013-05-19 DIAGNOSIS — R0609 Other forms of dyspnea: Secondary | ICD-10-CM | POA: Insufficient documentation

## 2013-05-19 LAB — CBC WITH DIFFERENTIAL/PLATELET
Basophils Relative: 0 % (ref 0–1)
Eosinophils Absolute: 0.2 10*3/uL (ref 0.0–0.7)
Hemoglobin: 14.6 g/dL (ref 12.0–15.0)
MCH: 29.4 pg (ref 26.0–34.0)
MCHC: 35.1 g/dL (ref 30.0–36.0)
Monocytes Absolute: 1 10*3/uL (ref 0.1–1.0)
Monocytes Relative: 13 % — ABNORMAL HIGH (ref 3–12)
Neutrophils Relative %: 44 % (ref 43–77)
RDW: 12.8 % (ref 11.5–15.5)

## 2013-05-19 LAB — BASIC METABOLIC PANEL
BUN: 19 mg/dL (ref 6–23)
Creatinine, Ser: 0.48 mg/dL — ABNORMAL LOW (ref 0.50–1.10)
GFR calc Af Amer: >90 mL/min (ref >90)
GFR calc non Af Amer: >90 mL/min (ref >90)
Potassium: 4 mEq/L (ref 3.5–5.1)

## 2013-05-19 MED ORDER — ALBUTEROL SULFATE HFA 108 (90 BASE) MCG/ACT IN AERS
2.0000 | INHALATION_SPRAY | RESPIRATORY_TRACT | Status: DC | PRN
  Administered 2013-05-19: 2 via RESPIRATORY_TRACT
  Filled 2013-05-19: qty 6.7

## 2013-05-19 MED ORDER — ALBUTEROL SULFATE (5 MG/ML) 0.5% IN NEBU
2.5000 mg | INHALATION_SOLUTION | Freq: Once | RESPIRATORY_TRACT | Status: AC
  Administered 2013-05-19: 2.5 mg via RESPIRATORY_TRACT
  Filled 2013-05-19: qty 0.5

## 2013-05-19 MED ORDER — PREDNISONE 20 MG PO TABS: 60.0000 mg | ORAL_TABLET | Freq: Every day | ORAL | Status: DC

## 2013-05-19 MED ORDER — LORAZEPAM 1 MG PO TABS
1.0000 mg | ORAL_TABLET | Freq: Once | ORAL | Status: AC
  Administered 2013-05-19: 1 mg via ORAL
  Filled 2013-05-19: qty 1

## 2013-05-19 MED ORDER — ASPIRIN 81 MG PO CHEW: 324.0000 mg | CHEWABLE_TABLET | Freq: Once | ORAL | Status: DC

## 2013-05-19 MED ORDER — METHYLPREDNISOLONE SODIUM SUCC 125 MG IJ SOLR
125.0000 mg | Freq: Once | INTRAMUSCULAR | Status: AC
  Administered 2013-05-19: 125 mg via INTRAVENOUS
  Filled 2013-05-19: qty 2

## 2013-05-19 MED ORDER — ASPIRIN 81 MG PO CHEW
324.0000 mg | CHEWABLE_TABLET | Freq: Once | ORAL | Status: AC
  Administered 2013-05-19: 324 mg via ORAL
  Filled 2013-05-19: qty 4

## 2013-05-19 NOTE — ED Provider Notes (Signed)
CSN: [id number]     Arrival date & time 05/19/13  0018 History     First MD Initiated Contact with Patient 05/19/13 0025     Chief Complaint  Patient presents with  . Chest Pain   (Consider location/radiation/quality/duration/timing/severity/associated sxs/prior Treatment) Patient is a 37 y.o. female presenting with chest pain. The history is provided by the patient.  Chest Pain She had onset about one hour ago of tight feeling across her chest. No tight feeling is moderate in intensity she rates it at 7/10. Nothing makes it better nothing makes it worse. There is associated dyspnea but no nausea or diaphoresis. Nothing makes it better nothing makes it worse. There is no actual pain per se. She's not had anything like this before. She is a cigarette smoker-smokes one pack of cigarettes a day. She denies history of hypertension, diabetes, hyperlipidemia. She denies any recent travel any recent surgery. She's not on oral contraceptives. There is a positive family history of premature coronary artery disease with father having had an MI in his early 32s.  Past Medical History  Diagnosis Date  . UTI (lower urinary tract infection)   . Anemia    Past Surgical History  Procedure Laterality Date  . Ectopic pregnancy surgery    . Wisdom tooth extraction    . Tubal ligation  10/08/2011    Procedure: POST PARTUM TUBAL LIGATION;  Surgeon: Catalina Antigua, MD;  Location: [facility];  Service: Gynecology;  Laterality: Bilateral;   No family history on file. History  Substance Use Topics  . Smoking status: Current Every Day Smoker -- 1.00 packs/day    Types: Cigarettes  . Smokeless tobacco: Never Used  . Alcohol Use: Yes   OB History   Grav Para Term Preterm Abortions TAB SAB Ect Mult Living   4 2 2  2   2  2      Review of Systems  Cardiovascular: Positive for chest pain.  All other systems reviewed and are negative.    Allergies  Review of patient's allergies indicates no known  allergies.  Home Medications   Current Outpatient Rx  Name  Route  Sig  Dispense  Refill  . Aspirin-Acetaminophen-Caffeine (GOODY HEADACHE PO)   Oral   Take 1 packet by mouth once as needed.          BP 135/72  Pulse 126  Temp(Src) 98.8 F (37.1 C) (Oral)  Resp 24  Ht 5\' 4"  (1.626 m)  Wt 114 lb (51.71 kg)  BMI 19.56 kg/m2  SpO2 100%  LMP 04/27/2013 Physical Exam  Nursing note and vitals reviewed.  37 year old female, who appears somewhat anxious and jittery, but is in no acute distress. Vital signs are significant for tachycardia with a rate of 126, and tachypnea with respiratory rate of 24. Oxygen saturation is 100%, which is normal. Head is normocephalic and atraumatic. PERRLA, EOMI. Oropharynx is clear. Neck is nontender and supple without adenopathy or JVD. Back is nontender and there is no CVA tenderness. Lungs are clear without rales, wheezes, or rhonchi. There is a slightly prolonged exhalation phase. Chest is nontender. Heart has regular rate and rhythm without murmur. Abdomen is soft, flat, nontender without masses or hepatosplenomegaly and peristalsis is normoactive. Extremities have no cyanosis or edema, full range of motion is present. Skin is warm and dry without rash. Neurologic: Mental status is normal, cranial nerves are intact, there are no motor or sensory deficits.  ED Course   Procedures (including critical care time)  Results for orders placed during the hospital encounter of 05/19/13  CBC WITH DIFFERENTIAL      Result Value Range   WBC 7.4  4.0 - 10.5 K/uL   RBC 4.96  3.87 - 5.11 MIL/uL   Hemoglobin 14.6  12.0 - 15.0 g/dL   HCT 40.9  81.1 - 91.4 %   MCV 83.9  78.0 - 100.0 fL   MCH 29.4  26.0 - 34.0 pg   MCHC 35.1  30.0 - 36.0 g/dL   RDW 78.2  95.6 - 21.3 %   Platelets 177  150 - 400 K/uL   Neutrophils Relative % 44  43 - 77 %   Neutro Abs 3.2  1.7 - 7.7 K/uL   Lymphocytes Relative 40  12 - 46 %   Lymphs Abs 3.0  0.7 - 4.0 K/uL    Monocytes Relative 13 (*) 3 - 12 %   Monocytes Absolute 1.0  0.1 - 1.0 K/uL   Eosinophils Relative 3  0 - 5 %   Eosinophils Absolute 0.2  0.0 - 0.7 K/uL   Basophils Relative 0  0 - 1 %   Basophils Absolute 0.0  0.0 - 0.1 K/uL  BASIC METABOLIC PANEL      Result Value Range   Sodium 136  135 - 145 mEq/L   Potassium 4.0  3.5 - 5.1 mEq/L   Chloride 101  96 - 112 mEq/L   CO2 22  19 - 32 mEq/L   Glucose, Bld 140 (*) 70 - 99 mg/dL   BUN 19  6 - 23 mg/dL   Creatinine, Ser 0.86 (*) 0.50 - 1.10 mg/dL   Calcium 9.6  8.4 - 57.8 mg/dL   GFR calc non Af Amer >90  >90 mL/min   GFR calc Af Amer >90  >90 mL/min  D-DIMER, QUANTITATIVE      Result Value Range   D-Dimer, Quant 0.34  0.00 - 0.48 ug/mL-FEU  TROPONIN I      Result Value Range   Troponin I <0.30  <0.30 ng/mL   Dg Chest 2 View  05/19/2013   *RADIOLOGY REPORT*  Clinical Data: Chest pain.  CHEST - 2 VIEW  Comparison: Chest x-ray 10/06/2012.  Findings: Lung volumes are normal.  No consolidative airspace disease.  No pleural effusions.  No pneumothorax.  No pulmonary nodule or mass noted.  Pulmonary vasculature and the cardiomediastinal silhouette are within normal limits.  IMPRESSION: 1. No radiographic evidence of acute cardiopulmonary disease.   Original Report Authenticated By: Trudie Reed, M.D.      Date: 05/19/2013  Rate: 122  Rhythm: sinus tachycardia  QRS Axis: normal  Intervals: normal  ST/T Wave abnormalities: normal  Conduction Disutrbances:none  Narrative Interpretation: Sinus tachycardia. When compared with ECG of 01/15/2012, no significant changes are seen.  Old EKG Reviewed: unchanged   1. Bronchospasm     MDM  Chest tightness with tachypnea and tachycardia. This may be bronchospasm and she'll be given a therapeutic trial of albuterol. She has no significant risk factors for pulmonary embolism symptoms are worrisome that she will be screened d-dimer. I doubt a cardiac etiology but will check troponin and given  initial dose of aspirin.  After albuterol, she was much better. She was no longer shaky or jittery and heart rate was down to 104. She states that her her lungs felt better but there was still a little bit of tightness. She is given a second albuterol nebulizer treatment and her heart rate shot up to  150 and she became very shaky and jittery again. With observation, she is in no longer shaky and jittery but heart rate is still at 127. She will be given a dose of lorazepam and reassessed.  Heart rate has come down to 110. She is resting comfortably at this point. She's advised to stop smoking and is sent home with a prescription for prednisone and is also given an albuterol inhaler to use every 4 hours as needed. Followup with her PCP.  Dione Booze, MD 05/19/13 [phone]

## 2013-05-19 NOTE — ED Notes (Signed)
Patient c/o left sided chest pain with no radiation x 30-45 minutes.  Patient states lying in bed when chest pain started.  Patient tachypneic during triage.

## 2013-06-02 ENCOUNTER — Ambulatory Visit (INDEPENDENT_AMBULATORY_CARE_PROVIDER_SITE_OTHER): Payer: BC Managed Care – PPO | Admitting: Physician Assistant

## 2013-06-02 ENCOUNTER — Encounter: Payer: Self-pay | Admitting: Physician Assistant

## 2013-06-02 VITALS — BP 132/70 | HR 136 | Temp 98.4°F | Resp 18 | Ht 62.2 in | Wt 112.0 lb

## 2013-06-02 DIAGNOSIS — R000 Tachycardia, unspecified: Secondary | ICD-10-CM

## 2013-06-02 DIAGNOSIS — E0590 Thyrotoxicosis, unspecified without thyrotoxic crisis or storm: Secondary | ICD-10-CM

## 2013-06-02 DIAGNOSIS — F419 Anxiety disorder, unspecified: Secondary | ICD-10-CM | POA: Insufficient documentation

## 2013-06-02 DIAGNOSIS — R634 Abnormal weight loss: Secondary | ICD-10-CM

## 2013-06-02 DIAGNOSIS — R002 Palpitations: Secondary | ICD-10-CM

## 2013-06-02 DIAGNOSIS — I498 Other specified cardiac arrhythmias: Secondary | ICD-10-CM

## 2013-06-02 DIAGNOSIS — K219 Gastro-esophageal reflux disease without esophagitis: Secondary | ICD-10-CM | POA: Insufficient documentation

## 2013-06-02 MED ORDER — METOPROLOL TARTRATE 25 MG PO TABS: 25.0000 mg | ORAL_TABLET | Freq: Two times a day (BID) | ORAL | Status: DC

## 2013-06-02 NOTE — Progress Notes (Signed)
Patient ID: TERRYL NIZIOLEK MRN: [id number], DOB: 1975-11-14, 37 y.o. Date of Encounter: @DATE @  Chief Complaint:  Chief Complaint  Patient presents with  . new pt est care    seen ED for chest pain  follow up    HPI: 37 y.o. year old white female  presents as a new patient to our practice. She is married with 2 children. They are 25 years old and 36 months old. She states that other than these pregnancies and deliveries she has had no significant past medical history.  She reports that she saw Dr. Sherwood Gambler in [place] last Wednesday. She went to see him because she had noticed feeling as if her body was shaky, and felt palpitations and has been having insomnia. As well she has noticed weight loss. She says that she probably weighed about 132 prior to her last pregnancy. Weight today is 112. She says she actually does not like being so thin and is losing weight without trying. She says that during the exam with Dr. Sherwood Gambler he noted that her thyroid gland felt enlarged. He did labs that showed low TSH. She says that the TSH was 0.008. I do not have a copy of the lab. She says that he told her this indicated that she was hyperthyroid. He is doing a referral to endocrinology. She has not yet gotten a phone call with that appointment. He did not prescribe any new medications except some Xanax to use when necessary  at that appointment. She is simply awaiting the endocrinology referral.  Prior to that appointment with Dr. Sherwood Gambler she actually gone to the [facility] emergency room with complaints of chest pain/palpitations and the same shaky anxious feeling as above. They told her that her chest discomfort was secondary to bronchospasm (she smokes). However she felt that this was not correct and that's why she followed up with Dr. Sherwood Gambler. I did note that the ER Documentation shows that her EKG showed sinus tachycardia in the 130s.   Past Medical History  Diagnosis Date  . UTI (lower urinary tract infection)    . Anemia   . Anxiety   . Hyperthyroidism   . GERD (gastroesophageal reflux disease)      Home Meds: See attached medication section for current medication list. Any medications entered into computer today will not appear on this note's list. The medications listed below were entered prior to today. No current outpatient prescriptions on file prior to visit.   No current facility-administered medications on file prior to visit.    Allergies: No Known Allergies  History   Social History  . Marital Status: Married    Spouse Name: N/A    Number of Children: N/A  . Years of Education: N/A   Occupational History  . Not on file.   Social History Main Topics  . Smoking status: Current Every Day Smoker -- 1.00 packs/day    Types: Cigarettes  . Smokeless tobacco: Never Used  . Alcohol Use: Yes  . Drug Use: No  . Sexual Activity: Yes   Other Topics Concern  . Not on file   Social History Narrative   ** Merged History Encounter **        Family History  Problem Relation Age of Onset  . Diabetes Father   . Heart disease Father 40    MI     Review of Systems:  See HPI for pertinent ROS. All other ROS negative.    Physical Exam: Blood pressure 132/70,  pulse 136, temperature 98.4 F (36.9 C), temperature source Oral, resp. rate 18, height 5' 2.2" (1.58 m), weight 112 lb (50.803 kg), last menstrual period 04/27/2013, not currently breastfeeding., Body mass index is 20.35 kg/(m^2). General: WNWD WFAppears in no acute distress. Neck: Supple. Thyroid gland is diffusely enlarged with no palpable nodules or masses. No lymphadenopathy. Lungs: Clear bilaterally to auscultation without wheezes, rales, or rhonchi. Breathing is unlabored. Heart: regular rhythm but tachycardic. No murmurs Musculoskeletal:  Strength and tone normal for age. Extremities/Skin: Warm and dry. No clubbing or cyanosis. No edema. No rashes or suspicious lesions. Neuro: Alert and oriented X 3. Moves all  extremities spontaneously. Gait is normal. CNII-XII grossly in tact. Psych:  Responds to questions appropriately with a normal affect.     ASSESSMENT AND PLAN:  37 y.o. year old female with  1. Hyperthyroidism Patient was very anxious and scared about her symptoms and the diagnosis of hyperthyroidism. I tried to explain more about this disease process  and the treatment. I reassured her that once she gets adequate treatment she will feel back to normal. Also explained that all of her current symptoms are secondary to the hyperthyroidism. Once the hyperthyroidism is treated all of these symptoms should resolve.  She states that Dr. Sherwood Gambler is referring her to an endocrinologist in [place]. She says that she would prefer to see an endocrinologist in [place]. Therefore I will go ahead and order a referral for this. She is to call Dr. Sharyon Medicus office to cancel that other referral. We'll go ahead and start Lopressor to control her heart rate. I think she will feel better and less jittery and anxious as we can get her heart rate controlled. - metoprolol tartrate (LOPRESSOR) 25 MG tablet; Take 1 tablet (25 mg total) by mouth 2 (two) times daily.  Dispense: 180 tablet; Refill: 3 - Ambulatory referral to Endocrinology  2. Palpitations - metoprolol tartrate (LOPRESSOR) 25 MG tablet; Take 1 tablet (25 mg total) by mouth 2 (two) times daily.  Dispense: 180 tablet; Refill: 3  3. Sinus tachycardia - metoprolol tartrate (LOPRESSOR) 25 MG tablet; Take 1 tablet (25 mg total) by mouth 2 (two) times daily.  Dispense: 180 tablet; Refill: 3  4. Weight loss  5. GERD (gastroesophageal reflux disease) Cont protonix prn. For this   Signed, Shon Hale Hughes, [place], [facility] 06/02/2013 11:26 AM

## 2013-06-21 ENCOUNTER — Telehealth: Payer: Self-pay | Admitting: Physician Assistant

## 2013-06-21 NOTE — Telephone Encounter (Signed)
New patient 06/02/13.  Last refill from Dr Sherwood Gambler on 06/04/13  #30 (10 day supply).  OK refill?

## 2013-06-21 NOTE — Telephone Encounter (Signed)
Xanax 0.25 mg (pt states that she takes 2-3 a day depending on her nerves and she also takes it to help her sleep)

## 2013-06-22 NOTE — Telephone Encounter (Signed)
Please present a [place] drug registry on this patient. If this is negative then can fill for #30 with 0 refills. Please give me a copy of the drug registry for my personal copyto review and keep for future reference as well .

## 2013-06-24 MED ORDER — ALPRAZOLAM 0.25 MG PO TABS: 0.2500 mg | ORAL_TABLET | Freq: Two times a day (BID) | ORAL | Status: DC

## 2013-06-24 NOTE — Addendum Note (Signed)
Addended by: Legrand Rams B on: 06/24/2013 01:59 PM   Modules accepted: Orders

## 2013-06-24 NOTE — Telephone Encounter (Signed)
Med c/o, pt aware and appt made

## 2013-06-29 ENCOUNTER — Ambulatory Visit (INDEPENDENT_AMBULATORY_CARE_PROVIDER_SITE_OTHER): Payer: BC Managed Care – PPO | Admitting: Physician Assistant

## 2013-06-29 ENCOUNTER — Encounter: Payer: Self-pay | Admitting: Physician Assistant

## 2013-06-29 VITALS — BP 144/82 | HR 96 | Temp 98.4°F | Resp 18 | Ht 63.25 in | Wt 116.0 lb

## 2013-06-29 DIAGNOSIS — E0590 Thyrotoxicosis, unspecified without thyrotoxic crisis or storm: Secondary | ICD-10-CM

## 2013-06-29 DIAGNOSIS — Z23 Encounter for immunization: Secondary | ICD-10-CM

## 2013-06-29 DIAGNOSIS — F411 Generalized anxiety disorder: Secondary | ICD-10-CM

## 2013-06-29 DIAGNOSIS — F419 Anxiety disorder, unspecified: Secondary | ICD-10-CM

## 2013-06-29 MED ORDER — ZOLPIDEM TARTRATE 5 MG PO TABS: 5.0000 mg | ORAL_TABLET | Freq: Every evening | ORAL | Status: DC | PRN

## 2013-06-29 NOTE — Progress Notes (Signed)
Patient ID: AALEEYAH BIAS MRN: [id number], DOB: 06-Mar-1976, 37 y.o. Date of Encounter: 06/29/2013, 8:58 AM    Chief Complaint:  Chief Complaint  Patient presents with  . med ck refill xanax     HPI: 37 y.o. year old white female is here for followup of her hyperthyroidism.  I saw her as a new patient to our office on 06/02/13.  At that time she reported that she is married with 2 children. They are 76 years old in 98 months old. She stated that other than his pregnancies and deliveries she had no significant past medical history until just these past few weeks.  She reported that she had seen Dr. Sherwood Gambler in [place] the week prior to the visit here. She went to him because she noticed feeling as if her body were shaky and she felt palpitations and was also having insomnia. As well she had noted weight loss. Says she weighed about 132 pounds prior to her past present pregnancy. Weight at the visit with me was at 112. She stated she actually did not light bleeding since then and losing weight without trying. During her exam Dr. Sherwood Gambler had noted that her thyroid gland was enlarged. He did labs revealed low TSH. She was told it is indicated she was hyperthyroid. He did however fall to endocrinology but at the time of the appointment with me on 06/02/13 she had not yet gotten a phone call about the appointment. He has not prescribed any new medications except for some Xanax to use when she was feeling anxious and shaky. Prior to that appointment Dr. Sherwood Gambler should actually go into any pan emergency room with complaints of chest pain/palpitations and also the same shaky anxious feeling as above. During that visit they did an EKG that shows sinus tachycardia in the 130s.  At the office visit with me on 06/02/13 heart rate was 136. Throat is very anxious and scared about her symptoms and a diagnosis of hyperthyroidism. I reassured her that once she got adequate treatment with the endocrinologist that her  symptoms should resolve. Explained all of her current symptoms are secondary to the hyperthyroidism.  I prescribed Lopressor 25 mg twice a day. She is taking this as directed. She is continue to use Xanax when necessary. She says that she usually needs one in the morning she feels very shaky at that time. As well she's been taking one every night because she is unable to sleep. Even with the Xanax she still is having significant insomnia.  Says she does have an appointment scheduled with Dr. Cleon Gustin endocrinology October 13.     Home Meds: See attached medication section for any medications that were entered at today's visit. The computer does not put those onto this list.The following list is a list of meds entered prior to today's visit.   Current Outpatient Prescriptions on File Prior to Visit  Medication Sig Dispense Refill  . ALPRAZolam (XANAX) 0.25 MG tablet Take 1 tablet (0.25 mg total) by mouth 2 (two) times daily.  30 tablet  0  . metoprolol tartrate (LOPRESSOR) 25 MG tablet Take 1 tablet (25 mg total) by mouth 2 (two) times daily.  180 tablet  3  . pantoprazole (PROTONIX) 40 MG tablet Take 1 tablet by mouth daily.       No current facility-administered medications on file prior to visit.    Allergies: No Known Allergies    Review of Systems: See HPI for pertinent ROS. All other ROS  negative.    Physical Exam: Blood pressure 144/82, pulse 96, temperature 98.4 F (36.9 C), temperature source Oral, resp. rate 18, height 5' 3.25" (1.607 m), weight 116 lb (52.617 kg), last menstrual period 06/17/2013, not currently breastfeeding., Body mass index is 20.37 kg/(m^2). General: Well-nourished well-developed white female Appears in no acute distress. Neck: Supple. Enlarged thyroid. No lymphadenopathy. Lungs: Clear bilaterally to auscultation without wheezes, rales, or rhonchi. Breathing is unlabored. Heart: Regular rhythm. No murmurs, rubs, or gallops. Msk:  Strength and tone normal  for age. Extremities/Skin: Warm and dry.  No edema. No rashes or suspicious lesions. Neuro: Alert and oriented X 3. Moves all extremities spontaneously. Gait is normal. CNII-XII grossly in tact. Psych:  Responds to questions appropriately with a normal affect.     ASSESSMENT AND PLAN:  37 y.o. year old female with  1. Hyperthyroidism A. sinus tachycardia: Continue current dose of Lopressor 25 mg twice a day B insomnia: - zolpidem (AMBIEN) 5 MG tablet; Take 1 tablet (5 mg total) by mouth at bedtime as needed for sleep.  Dispense: 15 tablet; Refill: 1 C. followup with Dr. Cleon Gustin October 13. 2. Anxiety Continue the Xanax when necessary.  Again reassured patient that her hyperthyroidism is something that is treatable. All of her current symptoms are secondary to hyperthyroidism. Once the hyperthyroidism is treated all of her symptoms should resolve.  3. Need for prophylactic vaccination and inoculation against influenza - Flu Vaccine QUAD 36+ mos PF IM (Fluarix)   Signed, [address] [place], [place], [facility] 06/29/2013 8:58 AM

## 2013-07-08 ENCOUNTER — Telehealth: Payer: Self-pay | Admitting: Physician Assistant

## 2013-07-08 NOTE — Telephone Encounter (Signed)
Concerned for refill.  Uses Xanax at least BID.  Sometimes takes two at bedtime.  #30 called no refills

## 2013-07-08 NOTE — Telephone Encounter (Signed)
Needs refill on Xanax .25

## 2013-07-08 NOTE — Telephone Encounter (Signed)
Last refill 06/24/13  #30   Taking BID  New pt has been seen 8/28 and 9/24  OK refill?

## 2013-07-11 MED ORDER — ALPRAZOLAM 0.25 MG PO TABS: 0.2500 mg | ORAL_TABLET | Freq: Two times a day (BID) | ORAL | Status: DC

## 2013-07-11 NOTE — Telephone Encounter (Signed)
Kim-  I made myself a sticky note to monitor her Xanax use closely. Make yourself a sticky note also. Do not give another refill in the future until we review  Further .

## 2013-07-13 ENCOUNTER — Ambulatory Visit (INDEPENDENT_AMBULATORY_CARE_PROVIDER_SITE_OTHER): Payer: BC Managed Care – PPO | Admitting: Physician Assistant

## 2013-07-13 ENCOUNTER — Encounter: Payer: Self-pay | Admitting: Physician Assistant

## 2013-07-13 VITALS — BP 124/84 | HR 76 | Temp 98.6°F | Resp 18 | Ht 63.5 in | Wt 110.0 lb

## 2013-07-13 DIAGNOSIS — F419 Anxiety disorder, unspecified: Secondary | ICD-10-CM

## 2013-07-13 DIAGNOSIS — F411 Generalized anxiety disorder: Secondary | ICD-10-CM

## 2013-07-13 DIAGNOSIS — R000 Tachycardia, unspecified: Secondary | ICD-10-CM

## 2013-07-13 DIAGNOSIS — E0590 Thyrotoxicosis, unspecified without thyrotoxic crisis or storm: Secondary | ICD-10-CM

## 2013-07-13 DIAGNOSIS — K219 Gastro-esophageal reflux disease without esophagitis: Secondary | ICD-10-CM

## 2013-07-13 DIAGNOSIS — I498 Other specified cardiac arrhythmias: Secondary | ICD-10-CM

## 2013-07-13 MED ORDER — METOPROLOL TARTRATE 25 MG PO TABS: ORAL_TABLET | ORAL | Status: DC

## 2013-07-13 NOTE — Progress Notes (Signed)
Patient ID: ODESTER NILSON MRN: [id number], DOB: [birth year], 37 y.o. Date of Encounter: 07/13/2013, 12:30 PM    Chief Complaint:  Chief Complaint  Patient presents with  . c/o heart racing    ? reaction to meds     HPI: 37 y.o. year old white female is here with complaints of palpitations. See my prior office notes for more complete details. My last office note dated 06/29/13 outlines every detail of this current problem with her hyperthyroidism.  Dr. Sherwood Gambler in [place] recently did labs that revealed hyperthyroidism. She has an appointment to see Dr. Cleon Gustin endocrinology October 13. We have been waiting for her to see Dr. Cleon Gustin at this appointment for her to actually treat the hyperthyroidism.  In the meantime patient has been seeing me for treatment of her symptoms which were all felt to be secondary to the hyperthyroidism.  Today she reports that on Sunday, 07/17/2013 she was doing nothing unusual when she began to feel her heart racing. She checked her heart rate and was in the 120s. She came in today for treatment of this.     Home Meds: See attached medication section for any medications that were entered at today's visit. The computer does not put those onto this list.The following list is a list of meds entered prior to today's visit.   Current Outpatient Prescriptions on File Prior to Visit  Medication Sig Dispense Refill  . ALPRAZolam (XANAX) 0.25 MG tablet Take 1 tablet (0.25 mg total) by mouth 2 (two) times daily.  30 tablet  0  . pantoprazole (PROTONIX) 40 MG tablet Take 1 tablet by mouth daily.      Marland Kitchen zolpidem (AMBIEN) 5 MG tablet Take 1 tablet (5 mg total) by mouth at bedtime as needed for sleep.  15 tablet  1   No current facility-administered medications on file prior to visit.    Allergies: No Known Allergies    Review of Systems: See HPI for pertinent ROS. All other ROS negative.    Physical Exam: Blood pressure 124/84, pulse 76, temperature 98.6 F (37  C), temperature source Oral, resp. rate 18, height 5' 3.5" (1.613 m), weight 110 lb (49.896 kg), last menstrual period 06/17/2013., Body mass index is 19.18 kg/(m^2). General: Well nourished well developed white female. Appears in no acute distress. Lungs: Clear bilaterally to auscultation without wheezes, rales, or rhonchi. Breathing is unlabored. Heart: Regular rhythm. No murmurs, rubs, or gallops. Msk:  Strength and tone normal for age. Extremities/Skin: Warm and dry. No clubbing or cyanosis. No edema. No rashes or suspicious lesions. Neuro: Alert and oriented X 3. Moves all extremities spontaneously. Gait is normal. CNII-XII grossly in tact. Psych:  Responds to questions appropriately with a normal affect.     ASSESSMENT AND PLAN:  37 y.o. year old female with  1. Hyperthyroidism - metoprolol tartrate (LOPRESSOR) 25 MG tablet; Take 2 tablets 2 times a day  Dispense: 180 tablet; Refill: 3  2. Sinus tachycardia Increase her Lopressor from 1 tablet twice daily to 2 tablets twice daily. Will not write out a new prescription for the 50 mg dose because she was already administered 90 pills of the 25 mg. As well she has a followup appointment with Dr. Cleon Gustin soon. Therefore the underlying hyperthyroidism should be treated soon and therefore she should not be needing this medication much longer. - metoprolol tartrate (LOPRESSOR) 25 MG tablet; Take 2 tablets 2 times a day  Dispense: 180 tablet; Refill: 3 3. Anxiety 4. GERD (  gastroesophageal reflux disease)   Signed, [address] [place], [place], [facility] 07/13/2013 12:30 PM

## 2013-07-26 ENCOUNTER — Other Ambulatory Visit (HOSPITAL_COMMUNITY): Payer: Self-pay | Admitting: Endocrinology

## 2013-07-26 DIAGNOSIS — E0590 Thyrotoxicosis, unspecified without thyrotoxic crisis or storm: Secondary | ICD-10-CM

## 2013-08-01 ENCOUNTER — Encounter (HOSPITAL_COMMUNITY)
Admission: RE | Admit: 2013-08-01 | Discharge: 2013-08-01 | Disposition: A | Discharge status: Home / Self Care | Payer: BC Managed Care – PPO | Source: Ambulatory Visit | Attending: Endocrinology | Admitting: Endocrinology

## 2013-08-01 ENCOUNTER — Encounter (HOSPITAL_COMMUNITY): Payer: Self-pay

## 2013-08-01 DIAGNOSIS — E0590 Thyrotoxicosis, unspecified without thyrotoxic crisis or storm: Secondary | ICD-10-CM

## 2013-08-01 MED ORDER — SODIUM IODIDE I 131 CAPSULE
10.0000 | Freq: Once | INTRAVENOUS | Status: AC | PRN
  Administered 2013-08-01: 10 via ORAL

## 2013-08-02 ENCOUNTER — Encounter (HOSPITAL_COMMUNITY)
Admission: RE | Admit: 2013-08-02 | Discharge: 2013-08-02 | Disposition: A | Discharge status: Home / Self Care | Payer: BC Managed Care – PPO | Source: Ambulatory Visit | Attending: Endocrinology | Admitting: Endocrinology

## 2013-08-02 ENCOUNTER — Encounter (HOSPITAL_COMMUNITY): Payer: Self-pay

## 2013-08-02 DIAGNOSIS — E0590 Thyrotoxicosis, unspecified without thyrotoxic crisis or storm: Secondary | ICD-10-CM | POA: Insufficient documentation

## 2013-08-02 MED ORDER — SODIUM PERTECHNETATE TC 99M INJECTION
10.0000 | Freq: Once | INTRAVENOUS | Status: AC | PRN
  Administered 2013-08-02: 10 via INTRAVENOUS

## 2013-08-04 ENCOUNTER — Ambulatory Visit (HOSPITAL_COMMUNITY): Payer: BC Managed Care – PPO

## 2013-08-05 ENCOUNTER — Ambulatory Visit (HOSPITAL_COMMUNITY): Payer: BC Managed Care – PPO

## 2013-08-05 ENCOUNTER — Other Ambulatory Visit (HOSPITAL_COMMUNITY): Payer: Self-pay | Admitting: Endocrinology

## 2013-08-05 DIAGNOSIS — E0590 Thyrotoxicosis, unspecified without thyrotoxic crisis or storm: Secondary | ICD-10-CM

## 2013-08-09 ENCOUNTER — Ambulatory Visit (HOSPITAL_COMMUNITY)
Admission: RE | Admit: 2013-08-09 | Discharge: 2013-08-09 | Disposition: A | Discharge status: Home / Self Care | Payer: BC Managed Care – PPO | Source: Ambulatory Visit | Attending: Endocrinology | Admitting: Endocrinology

## 2013-08-09 DIAGNOSIS — E0590 Thyrotoxicosis, unspecified without thyrotoxic crisis or storm: Secondary | ICD-10-CM | POA: Insufficient documentation

## 2013-08-09 MED ORDER — SODIUM IODIDE I 131 CAPSULE
12.0000 | Freq: Once | INTRAVENOUS | Status: AC | PRN
  Administered 2013-08-09: 12 via ORAL

## 2013-08-25 ENCOUNTER — Ambulatory Visit (INDEPENDENT_AMBULATORY_CARE_PROVIDER_SITE_OTHER): Payer: BC Managed Care – PPO | Admitting: Physician Assistant

## 2013-08-25 ENCOUNTER — Encounter: Payer: Self-pay | Admitting: Physician Assistant

## 2013-08-25 VITALS — BP 104/60 | HR 98 | Temp 98.5°F | Resp 18 | Ht 63.0 in | Wt 113.0 lb

## 2013-08-25 DIAGNOSIS — K219 Gastro-esophageal reflux disease without esophagitis: Secondary | ICD-10-CM

## 2013-08-25 DIAGNOSIS — E0590 Thyrotoxicosis, unspecified without thyrotoxic crisis or storm: Secondary | ICD-10-CM

## 2013-08-25 DIAGNOSIS — F411 Generalized anxiety disorder: Secondary | ICD-10-CM

## 2013-08-25 DIAGNOSIS — F419 Anxiety disorder, unspecified: Secondary | ICD-10-CM

## 2013-08-25 DIAGNOSIS — K5900 Constipation, unspecified: Secondary | ICD-10-CM

## 2013-08-25 DIAGNOSIS — J309 Allergic rhinitis, unspecified: Secondary | ICD-10-CM

## 2013-08-25 MED ORDER — PANTOPRAZOLE SODIUM 40 MG PO TBEC: 40.0000 mg | DELAYED_RELEASE_TABLET | Freq: Every day | ORAL | Status: DC

## 2013-08-25 NOTE — Progress Notes (Signed)
Patient ID: CHAKA BOYSON MRN: [id number], DOB: 09/02/1976, 37 y.o. Date of Encounter: 08/25/2013, 10:26 AM    Chief Complaint:  Chief Complaint  Patient presents with  . BMs painful and bleeding  . Sinusitis    went to dentist thought it was toothache, had xray done and dr. told her it was sinuses     HPI: 37 y.o. year old white female here for the above problems.  Reports that she was experiencing pain and discomfort in her left cheek area for about 2 weeks. However she has not felt that discomfort for the past 3 or 4 days. She saw her dentist who did x-rays and evaluation. Dentist told her that it was her sinuses. She says that she has had some watery drainage from both her eyes and nose. However has had no thick mucous. Has had no ear ache. No fever. No cough.  She also states that most one year ago she saw Dr. Sherwood Gambler and was told that she had a hemorrhoid. She says that when her stools are soft she has no problems. However when her stool is hard, she feels pain in her rectal area when the stool comes out and also sees bright red blood.  She takes no medication for constipation.  She says that she will go for months of having hard stool but then go for  months of having soft stool.     Home Meds: See attached medication section for any medications that were entered at today's visit. The computer does not put those onto this list.The following list is a list of meds entered prior to today's visit.   Current Outpatient Prescriptions on File Prior to Visit  Medication Sig Dispense Refill  . metoprolol tartrate (LOPRESSOR) 25 MG tablet Take 2 tablets 2 times a day  180 tablet  3  . ALPRAZolam (XANAX) 0.25 MG tablet Take 1 tablet (0.25 mg total) by mouth 2 (two) times daily.  30 tablet  0  . HYDROcodone-acetaminophen (NORCO/VICODIN) 5-325 MG per tablet Take 1 tablet by mouth as needed. For dental work      . penicillin v potassium (VEETID) 500 MG tablet Has but not to take unless  needed      . zolpidem (AMBIEN) 5 MG tablet Take 1 tablet (5 mg total) by mouth at bedtime as needed for sleep.  15 tablet  1   No current facility-administered medications on file prior to visit.    Allergies: No Known Allergies    Review of Systems: See HPI for pertinent ROS. All other ROS negative.    Physical Exam: Blood pressure 104/60, pulse 98, temperature 98.5 F (36.9 C), temperature source Oral, resp. rate 18, height 5\' 3"  (1.6 m), weight 113 lb (51.256 kg), last menstrual period 08/09/2013., Body mass index is 20.02 kg/(m^2). General: WNWD WF.  Appears in no acute distress. HEENT: Normocephalic, atraumatic, eyes without discharge, sclera non-icteric, nares are without discharge. Bilateral auditory canals clear, TM's are without perforation, pearly grey and translucent with reflective cone of light bilaterally. Oral cavity moist, posterior pharynx without exudate, erythema, peritonsillar abscess, or post nasal drip. No tenderness with percussion of frontal sinuses or bilateral maxillary sinuses.  Neck: Supple. No thyromegaly. No lymphadenopathy. Lungs: Clear bilaterally to auscultation without wheezes, rales, or rhonchi. Breathing is unlabored. Heart: Regular rhythm. No murmurs, rubs, or gallops. Abdomen: Soft, non-tender, non-distended with normoactive bowel sounds. No hepatomegaly. No rebound/guarding. No obvious abdominal masses. Msk:  Strength and tone normal for age.  Extremities/Skin: Warm and dry. No clubbing or cyanosis. No edema. No rashes or suspicious lesions. Neuro: Alert and oriented X 3. Moves all extremities spontaneously. Gait is normal. CNII-XII grossly in tact. Psych:  Responds to questions appropriately with a normal affect.     ASSESSMENT AND PLAN:  37 y.o. year old female with  1. Constipation Recommend using over-the-counter MiraLax as needed to keep her stools soft. This will prevent pain and bleeding.  2. Allergic rhinosinusitis Recommend adding  over-the-counter either Zyrtec, Claritin, or Allegra.  3. GERD (gastroesophageal reflux disease) SHe is requesting refills on her Protonix. - pantoprazole (PROTONIX) 40 MG tablet; Take 1 tablet (40 mg total) by mouth daily.  Dispense: 30 tablet; Refill: 11  4. Hyperthyroidism She says that she has seen Dr.B Freida Busman and has been given a pill to take that as opposed to treat this. Dr. Cleon Gustin is following a subsequent lab work.  5. Anxiety Secondary to #4. Currently controlled and stable.   Signed, [address] [place], [place], [facility] 08/25/2013 10:26 AM

## 2013-11-03 ENCOUNTER — Ambulatory Visit (INDEPENDENT_AMBULATORY_CARE_PROVIDER_SITE_OTHER): Payer: BC Managed Care – PPO | Admitting: Physician Assistant

## 2013-11-03 ENCOUNTER — Encounter: Payer: Self-pay | Admitting: Physician Assistant

## 2013-11-03 VITALS — BP 118/76 | HR 100 | Temp 98.6°F | Resp 18 | Wt 121.0 lb

## 2013-11-03 DIAGNOSIS — A088 Other specified intestinal infections: Secondary | ICD-10-CM

## 2013-11-03 DIAGNOSIS — R112 Nausea with vomiting, unspecified: Secondary | ICD-10-CM

## 2013-11-03 DIAGNOSIS — A084 Viral intestinal infection, unspecified: Secondary | ICD-10-CM

## 2013-11-03 DIAGNOSIS — R197 Diarrhea, unspecified: Secondary | ICD-10-CM

## 2013-11-03 LAB — INFLUENZA A AND B
INFLUENZA A AG: NEGATIVE
Influenza B Ag: NEGATIVE

## 2013-11-03 NOTE — Progress Notes (Signed)
Patient ID: Kristin [name] MRN: [id number], DOB: 10/10/1975, 38 y.o. Date of Encounter: 11/03/2013, 11:14 AM    Chief Complaint:  Chief Complaint  Patient presents with  . c/o nausea and diarrhea since yesterday    has question about metoprolol  Dr Talmage NapBalan wanted to try to get her off of it??     HPI: 38 y.o. year old white female states that this started yesterday morning. This issues actually able to stay at work until lunch and then had to leave. Visit she was having watery stools very frequently yesterday. States that the frequency has decreased significantly today. She feels nauseous but has had no vomiting.  She has been seeing Dr. Cleon GustinBallin regarding hyperthyroidism. She underwent radioactive therapy. Says that last Friday  Dr. Cleon GustinBallin actually started her on levothyroxine because her numbers actually were now consistent with hypothyroid. Is Dr. Cleon GustinBallin told her that she could " wean off of the metoprolol." Pt says that after she got home she was she wasn't sure how she does this to wean off.  I reviewed her current dose. We had down that she was having metoprolol 25 mg 2 twice a day. She says that she was taking that amount in the past when her thyroid was really Uncontrolled. However she says that the dose had already been cut down to just one tablet twice a day. She is currently taking 25 mg one pill twice a day.     Home Meds: See attached medication section for any medications that were entered at today's visit. The computer does not put those onto this list.The following list is a list of meds entered prior to today's visit.   Current Outpatient Prescriptions on File Prior to Visit  Medication Sig Dispense Refill  . metoprolol tartrate (LOPRESSOR) 25 MG tablet Take 2 tablets 2 times a day  180 tablet  3  . pantoprazole (PROTONIX) 40 MG tablet Take 1 tablet (40 mg total) by mouth daily.  30 tablet  11  . ALPRAZolam (XANAX) 0.25 MG tablet Take 1 tablet (0.25 mg total) by mouth 2 (two)  times daily.  30 tablet  0  . zolpidem (AMBIEN) 5 MG tablet Take 1 tablet (5 mg total) by mouth at bedtime as needed for sleep.  15 tablet  1   No current facility-administered medications on file prior to visit.    Allergies: No Known Allergies    Review of Systems: See HPI for pertinent ROS. All other ROS negative.    Physical Exam: Blood pressure 118/76, pulse 100, temperature 98.6 F (37 C), temperature source Oral, resp. rate 18, weight 121 lb (54.885 kg), last menstrual period 10/27/2013., Body mass index is 21.44 kg/(m^2). General:  WNWD WF. Appears in no acute distress. Lungs: Clear bilaterally to auscultation without wheezes, rales, or rhonchi. Breathing is unlabored. Heart: Regular rhythm. No murmurs, rubs, or gallops. Abdomen: Soft, , non-distended with normoactive bowel sounds. No hepatomegaly. No rebound/guarding. No obvious abdominal masses. There is no localized/focal area of tenderness with palpation. Msk:  Strength and tone normal for age. Extremities/Skin: Warm and dry. No clubbing or cyanosis. No edema. No rashes or suspicious lesions. Neuro: Alert and oriented X 3. Moves all extremities spontaneously. Gait is normal. CNII-XII grossly in tact. Psych:  Responds to questions appropriately with a normal affect.   Results for orders placed in visit on 11/03/13  INFLUENZA A AND B      Result Value Range   Source-INFBD NASAL  Inflenza A Ag NEG  Negative   Influenza B Ag NEG  Negative     ASSESSMENT AND PLAN:  38 y.o. year old female with  1. Viral gastroenteritis Continue to drink fluids to prevent dehydration. Continue clear liquid diet until diarrhea resolves. Then can gradually return to a bland diet. Followup if symptoms worsen or do not resolve in several days.  2. Nausea, vomiting and diarrhea - Influenza a and b  Regarding her dose of metoprolol, I discussed she is already  on very low dose and she really does not have to wean off and can simply stop  it.  Signed, [address] [place], [place], [facility] 11/03/2013 11:14 AM

## 2013-11-18 ENCOUNTER — Ambulatory Visit (INDEPENDENT_AMBULATORY_CARE_PROVIDER_SITE_OTHER): Payer: BC Managed Care – PPO | Admitting: Family Medicine

## 2013-11-18 VITALS — BP 110/70 | HR 78 | Temp 98.6°F | Resp 18 | Ht 62.0 in | Wt 121.0 lb

## 2013-11-18 DIAGNOSIS — D649 Anemia, unspecified: Secondary | ICD-10-CM

## 2013-11-18 DIAGNOSIS — R5381 Other malaise: Secondary | ICD-10-CM

## 2013-11-18 DIAGNOSIS — R5383 Other fatigue: Secondary | ICD-10-CM

## 2013-11-18 DIAGNOSIS — E039 Hypothyroidism, unspecified: Secondary | ICD-10-CM

## 2013-11-18 DIAGNOSIS — K219 Gastro-esophageal reflux disease without esophagitis: Secondary | ICD-10-CM

## 2013-11-18 LAB — COMPREHENSIVE METABOLIC PANEL
ALBUMIN: 4.4 g/dL (ref 3.5–5.2)
ALK PHOS: 99 U/L (ref 39–117)
ALT: 16 U/L (ref 0–35)
AST: 18 U/L (ref 0–37)
BILIRUBIN TOTAL: 0.7 mg/dL (ref 0.2–1.2)
BUN: 14 mg/dL (ref 6–23)
CO2: 26 mEq/L (ref 19–32)
Calcium: 9.2 mg/dL (ref 8.4–10.5)
Chloride: 107 mEq/L (ref 96–112)
Creat: 0.89 mg/dL (ref 0.50–1.10)
GLUCOSE: 71 mg/dL (ref 70–99)
POTASSIUM: 5 meq/L (ref 3.5–5.3)
SODIUM: 138 meq/L (ref 135–145)
TOTAL PROTEIN: 6.9 g/dL (ref 6.0–8.3)

## 2013-11-18 LAB — CBC WITH DIFFERENTIAL/PLATELET
BASOS PCT: 0 % (ref 0–1)
Basophils Absolute: 0 10*3/uL (ref 0.0–0.1)
EOS ABS: 0.2 10*3/uL (ref 0.0–0.7)
EOS PCT: 2 % (ref 0–5)
HCT: 43.1 % (ref 36.0–46.0)
HEMOGLOBIN: 14.7 g/dL (ref 12.0–15.0)
LYMPHS ABS: 2.2 10*3/uL (ref 0.7–4.0)
Lymphocytes Relative: 23 % (ref 12–46)
MCH: 29.8 pg (ref 26.0–34.0)
MCHC: 34.1 g/dL (ref 30.0–36.0)
MCV: 87.4 fL (ref 78.0–100.0)
MONOS PCT: 9 % (ref 3–12)
Monocytes Absolute: 0.9 10*3/uL (ref 0.1–1.0)
NEUTROS PCT: 66 % (ref 43–77)
Neutro Abs: 6.4 10*3/uL (ref 1.7–7.7)
Platelets: 178 10*3/uL (ref 150–400)
RBC: 4.93 MIL/uL (ref 3.87–5.11)
RDW: 15.6 % — ABNORMAL HIGH (ref 11.5–15.5)
WBC: 9.8 10*3/uL (ref 4.0–10.5)

## 2013-11-18 NOTE — Patient Instructions (Signed)
Take 1/2 tablet of metoprolol  Twice a day Stop the protonix and try the dexliant Let us know if dexilant helps and we will send script  F/U as previous as needed

## 2013-11-20 ENCOUNTER — Encounter: Payer: Self-pay | Admitting: Family Medicine

## 2013-11-20 DIAGNOSIS — E039 Hypothyroidism, unspecified: Secondary | ICD-10-CM | POA: Insufficient documentation

## 2013-11-20 NOTE — Assessment & Plan Note (Signed)
History of anemia will check her hemoglobin

## 2013-11-20 NOTE — Assessment & Plan Note (Signed)
I think some of her fatigue and other symptoms may be due to her hypothyroidism which is acquired from her had been what sounds to be radioactive ablation for her Luiz BlareGraves' disease she was just recently started on thyroid replacement

## 2013-11-20 NOTE — Assessment & Plan Note (Signed)
I will switch her from Protonix to dexilant to see if this helps her GI symptoms I will also check metabolic panel and an H. pylori

## 2013-11-20 NOTE — Progress Notes (Signed)
Patient ID: Kristin [name], female   DOB: 04/28/1976, 38 y.o.   MRN: [id number]   Subjective:    Patient ID: Kristin CarbonKeesha M Seal, female    DOB: 11/19/1975, 38 y.o.   MRN: [id number]  Patient presents for not feeling well  patient presents with vague symptoms. States she just does not feel well. She has history of Graves' disease and status post radioactive ablation she now is hypothyroid and was started on 50 mcg of Synthroid 3 weeks ago. She states that she feels fatigued with very little energy she also has had a few dizzy spells and just does not feel like herself. She's not had any significant weight gain. She also complains of some GI discomfort feels a burning and knowledge sensation in her epigastric region sometimes it radiates up the chest. She is on protoniX. but she continues to have these symptoms along with belching she does try drinking caffeinated drinks to help. She's not had any recent illness or fever. She's not had any change in her bowels or nausea vomiting   Review Of Systems:  GEN- + fatigue, fever, weight loss,weakness, recent illness HEENT- denies eye drainage, change in vision, nasal discharge, CVS- denies chest pain, palpitations RESP- denies SOB, cough, wheeze ABD- denies N/V, change in stools, abd pain GU- denies dysuria, hematuria, dribbling, incontinence MSK- denies joint pain, muscle aches, injury Neuro- denies headache, +dizziness, syncope, seizure activity       Objective:    BP 110/70  Pulse 78  Temp(Src) 98.6 F (37 C) (Oral)  Resp 18  Ht 5\' 2"  (1.575 m)  Wt 121 lb (54.885 kg)  BMI 22.13 kg/m2  LMP 10/27/2013 GEN- NAD, alert and oriented x3 HEENT- PERRL, EOMI, non injected sclera, pink conjunctiva, MMM, oropharynx clear Neck- Supple, mild thyromegaly CVS- RRR, no murmur RESP-CTAB ABD-NABS,soft,NT,ND EXT- No edema Pulses- Radial 2+ NEURO- CNII-XII in tact        Assessment & Plan:      Problem List Items Addressed This Visit   Hypothyroidism  (acquired) - Primary     I think some of her fatigue and other symptoms may be due to her hypothyroidism which is acquired from her had been what sounds to be radioactive ablation for her Graves' disease she was just recently started on thyroid replacement    Relevant Medications      metoprolol tartrate (LOPRESSOR) 25 MG tablet   GERD (gastroesophageal reflux disease)     I will switch her from Protonix to dexilant to see if this helps her GI symptoms I will also check metabolic panel and an H. pylori    Relevant Orders      Helicobacter pylori abs-IgG+IgA, bld      HELICOBACTER PYLORI  ANTIBODY, IGM   Anemia     History of anemia will check her hemoglobin     Other Visit Diagnoses   Fatigue        Relevant Orders       Comprehensive metabolic panel (Completed)       CBC with Differential (Completed)       Note: This dictation was prepared with Dragon dictation along with smaller phrase technology. Any transcriptional errors that result from this process are unintentional.

## 2013-11-22 LAB — HELICOBACTER PYLORI ABS-IGG+IGA, BLD
H PYLORI IGG: 0.46 {ISR}
HELICOBACTER PYLORI AB, IGA: 1.6 U/mL (ref <9.0)

## 2013-11-22 LAB — HELICOBACTER PYLORI  ANTIBODY, IGM: Helicobacter pylori, IgM: 17.2 U/mL — ABNORMAL HIGH (ref <9.0)

## 2013-11-23 ENCOUNTER — Ambulatory Visit: Payer: BC Managed Care – PPO | Admitting: Family Medicine

## 2013-12-06 ENCOUNTER — Telehealth: Payer: Self-pay | Admitting: *Deleted

## 2013-12-06 NOTE — Telephone Encounter (Signed)
Message copied by Phillips OdorSIX, CHRISTINA H on Tue Dec 06, 2013  4:09 PM ------      Message from: Eustaquio MaizeMEDLIN, SARAH J      Created: Tue Dec 06, 2013  3:12 PM      Contact: [phone]       Please call her regarding some type of lab test ------

## 2013-12-06 NOTE — Telephone Encounter (Signed)
Received call from patient. Advised that H Pylori Breath Test could be performed tomorrow if she has stopped taking her protonix.

## 2013-12-07 ENCOUNTER — Other Ambulatory Visit: Payer: BC Managed Care – PPO

## 2013-12-07 DIAGNOSIS — A048 Other specified bacterial intestinal infections: Secondary | ICD-10-CM

## 2013-12-08 ENCOUNTER — Telehealth: Payer: Self-pay | Admitting: *Deleted

## 2013-12-08 LAB — H. PYLORI BREATH TEST: H. PYLORI BREATH TEST: NOT DETECTED

## 2013-12-08 MED ORDER — LEVOTHYROXINE SODIUM 100 MCG PO TABS: 100.0000 ug | ORAL_TABLET | Freq: Every day | ORAL | Status: DC

## 2013-12-08 NOTE — Telephone Encounter (Signed)
Received call from patient. Stated that TSH elevated per endocrinologist. Stated that they increased Levothyroxine to . Medication profile updated.

## 2013-12-09 NOTE — Telephone Encounter (Signed)
noted 

## 2013-12-13 ENCOUNTER — Other Ambulatory Visit: Payer: Self-pay | Admitting: Family Medicine

## 2013-12-13 ENCOUNTER — Ambulatory Visit (INDEPENDENT_AMBULATORY_CARE_PROVIDER_SITE_OTHER): Payer: BC Managed Care – PPO | Admitting: Family Medicine

## 2013-12-13 ENCOUNTER — Encounter: Payer: Self-pay | Admitting: Family Medicine

## 2013-12-13 VITALS — BP 118/74 | HR 82 | Temp 98.7°F | Resp 16 | Ht 63.0 in | Wt 122.0 lb

## 2013-12-13 DIAGNOSIS — Z13220 Encounter for screening for lipoid disorders: Secondary | ICD-10-CM

## 2013-12-13 DIAGNOSIS — Z124 Encounter for screening for malignant neoplasm of cervix: Secondary | ICD-10-CM

## 2013-12-13 DIAGNOSIS — A499 Bacterial infection, unspecified: Secondary | ICD-10-CM

## 2013-12-13 DIAGNOSIS — N760 Acute vaginitis: Secondary | ICD-10-CM

## 2013-12-13 DIAGNOSIS — Z130 Encounter for screening for diseases of the blood and blood-forming organs and certain disorders involving the immune mechanism: Secondary | ICD-10-CM

## 2013-12-13 DIAGNOSIS — F17200 Nicotine dependence, unspecified, uncomplicated: Secondary | ICD-10-CM

## 2013-12-13 DIAGNOSIS — Z1329 Encounter for screening for other suspected endocrine disorder: Secondary | ICD-10-CM

## 2013-12-13 DIAGNOSIS — B9689 Other specified bacterial agents as the cause of diseases classified elsewhere: Secondary | ICD-10-CM

## 2013-12-13 DIAGNOSIS — N898 Other specified noninflammatory disorders of vagina: Secondary | ICD-10-CM

## 2013-12-13 DIAGNOSIS — Z13228 Encounter for screening for other metabolic disorders: Secondary | ICD-10-CM

## 2013-12-13 DIAGNOSIS — Z1321 Encounter for screening for nutritional disorder: Secondary | ICD-10-CM

## 2013-12-13 DIAGNOSIS — Z8249 Family history of ischemic heart disease and other diseases of the circulatory system: Secondary | ICD-10-CM

## 2013-12-13 DIAGNOSIS — Z0000 Encounter for general adult medical examination without abnormal findings: Secondary | ICD-10-CM

## 2013-12-13 LAB — WET PREP FOR TRICH, YEAST, CLUE
Trich, Wet Prep: NONE SEEN
Yeast Wet Prep HPF POC: NONE SEEN

## 2013-12-13 LAB — LIPID PANEL
CHOL/HDL RATIO: 3.5 ratio
Cholesterol: 150 mg/dL (ref 0–200)
HDL: 43 mg/dL (ref >39)
LDL CALC: 93 mg/dL (ref 0–99)
TRIGLYCERIDES: 68 mg/dL (ref <150)
VLDL: 14 mg/dL (ref 0–40)

## 2013-12-13 MED ORDER — METRONIDAZOLE 500 MG PO TABS: 500.0000 mg | ORAL_TABLET | Freq: Two times a day (BID) | ORAL | Status: DC

## 2013-12-13 MED ORDER — CARBAMIDE PEROXIDE 6.5 % OT SOLN: 5.0000 [drp] | Freq: Two times a day (BID) | OTIC | Status: DC

## 2013-12-13 NOTE — Patient Instructions (Signed)
I recommend eye visit once a year I recommend dental visit every 6 months Goal is to  Exercise 30 minutes 5 days a week We will send a letter with lab results  Work on the smoking,call if you want to try the patches  F/U in 1 year or as needed

## 2013-12-13 NOTE — Assessment & Plan Note (Signed)
EKG normal.

## 2013-12-13 NOTE — Progress Notes (Signed)
Patient ID: Kristin [name], female   DOB: 04/10/1976, 38 y.o.   MRN: [id number]     Subjective:    Patient ID: Kristin [name], female    DOB: 01/17/1976, 38 y.o.   MRN: [id number]  Patient presents for CPE  patient here for complete physical exam. She inquired about preventive measures that she has a family history of coronary artery disease her father had myocardial infarction at an early age. She is a smoker but is not ready to quit. Her last menstrual period was a week ago. Her last Pap smear was in 2013 this was normal.  She's also had vaginal discharge for the past couple weeks with a mild odor. She denies any pelvic pain Married No family history of breast cancer there for mammogram not needed until age 38 Due for lipid panel    Review Of Systems:  GEN- denies fatigue, fever, weight loss,weakness, recent illness HEENT- denies eye drainage, change in vision, nasal discharge, CVS- denies chest pain, palpitations RESP- denies SOB, cough, wheeze ABD- denies N/V, change in stools, abd pain GU- denies dysuria, hematuria, dribbling, incontinence MSK- denies joint pain, muscle aches, injury Neuro- denies headache, dizziness, syncope, seizure activity       Objective:    BP 118/74  Pulse 82  Temp(Src) 98.7 F (37.1 C)  Resp 16  Ht 5\' 3"  (1.6 m)  Wt 122 lb (55.339 kg)  BMI 21.62 kg/m2  LMP 12/05/2013 GEN- NAD, alert and oriented x3 HEENT- PERRL, EOMI, non injected sclera, pink conjunctiva, MMM, oropharynx clear Neck- Supple,  Breast- normal symmetry, no nipple inversion,no nipple drainage, no nodules or lumps felt Nodes- no axillary nodes CVS- RRR, no murmur RESP-CTAB ABD-NABS,soft,NT,ND GU- normal external genitalia, vaginal mucosa pink and moist, cervix visualized no growth, no blood form os, minimal thin clear discharge, no CMT, no ovarian masses, uterus normal size EXT- No edema Pulses- Radial, DP- 2+  EKG - NSR, incomplete RBBB      Assessment & Plan:       Problem List Items Addressed This Visit   None    Visit Diagnoses   Vaginal discharge    -  Primary    Relevant Orders       WET PREP FOR TRICH, YEAST, CLUE       GC/chlamydia probe amp, genital    Screening for malignant neoplasm of the cervix        Relevant Orders       PAP, ThinPrep ASCUS Rflx HPV Rflx Type    Lipid screening        Relevant Orders       Lipid panel    Encounter for vitamin deficiency screening        Relevant Orders       Vitamin D, 25-hydroxy       Note: This dictation was prepared with Dragon dictation along with smaller phrase technology. Any transcriptional errors that result from this process are unintentional.

## 2013-12-13 NOTE — Assessment & Plan Note (Signed)
Counseled on cessation, she is not ready to quit

## 2013-12-14 LAB — GC/CHLAMYDIA PROBE AMP
CT PROBE, AMP APTIMA: NEGATIVE
GC Probe RNA: NEGATIVE

## 2013-12-14 LAB — VITAMIN D 25 HYDROXY (VIT D DEFICIENCY, FRACTURES): VIT D 25 HYDROXY: 20 ng/mL — AB (ref 30–89)

## 2013-12-14 LAB — PAP THINPREP ASCUS RFLX HPV RFLX TYPE

## 2013-12-14 MED ORDER — VITAMIN D (ERGOCALCIFEROL) 1.25 MG (50000 UNIT) PO CAPS: 50000.0000 [IU] | ORAL_CAPSULE | ORAL | Status: DC

## 2013-12-14 NOTE — Addendum Note (Signed)
Addended by: Milinda AntisURHAM, Saralee Bolick F on: 12/14/2013 10:12 PM   Modules accepted: Orders

## 2013-12-15 ENCOUNTER — Encounter: Payer: Self-pay | Admitting: *Deleted

## 2014-01-02 ENCOUNTER — Encounter: Payer: Self-pay | Admitting: Family Medicine

## 2014-01-11 ENCOUNTER — Other Ambulatory Visit: Payer: Self-pay | Admitting: Family Medicine

## 2014-01-11 DIAGNOSIS — E559 Vitamin D deficiency, unspecified: Secondary | ICD-10-CM

## 2014-03-28 ENCOUNTER — Encounter (HOSPITAL_COMMUNITY): Payer: Self-pay | Admitting: Emergency Medicine

## 2014-03-28 ENCOUNTER — Emergency Department (HOSPITAL_COMMUNITY)
Admission: EM | Admit: 2014-03-28 | Discharge: 2014-03-28 | Disposition: A | Discharge status: Home / Self Care | Payer: BC Managed Care – PPO | Attending: Emergency Medicine | Admitting: Emergency Medicine

## 2014-03-28 DIAGNOSIS — Z79899 Other long term (current) drug therapy: Secondary | ICD-10-CM | POA: Insufficient documentation

## 2014-03-28 DIAGNOSIS — Z8719 Personal history of other diseases of the digestive system: Secondary | ICD-10-CM | POA: Insufficient documentation

## 2014-03-28 DIAGNOSIS — Z8659 Personal history of other mental and behavioral disorders: Secondary | ICD-10-CM | POA: Insufficient documentation

## 2014-03-28 DIAGNOSIS — Z87440 Personal history of urinary (tract) infections: Secondary | ICD-10-CM | POA: Insufficient documentation

## 2014-03-28 DIAGNOSIS — R1031 Right lower quadrant pain: Secondary | ICD-10-CM

## 2014-03-28 DIAGNOSIS — Z9851 Tubal ligation status: Secondary | ICD-10-CM | POA: Insufficient documentation

## 2014-03-28 DIAGNOSIS — Z862 Personal history of diseases of the blood and blood-forming organs and certain disorders involving the immune mechanism: Secondary | ICD-10-CM | POA: Insufficient documentation

## 2014-03-28 DIAGNOSIS — Z3202 Encounter for pregnancy test, result negative: Secondary | ICD-10-CM | POA: Insufficient documentation

## 2014-03-28 DIAGNOSIS — E0590 Thyrotoxicosis, unspecified without thyrotoxic crisis or storm: Secondary | ICD-10-CM | POA: Insufficient documentation

## 2014-03-28 DIAGNOSIS — F17200 Nicotine dependence, unspecified, uncomplicated: Secondary | ICD-10-CM | POA: Insufficient documentation

## 2014-03-28 HISTORY — DX: Thyrotoxicosis with diffuse goiter without thyrotoxic crisis or storm: E05.00

## 2014-03-28 LAB — COMPREHENSIVE METABOLIC PANEL
ALBUMIN: 4 g/dL (ref 3.5–5.2)
ALT: 17 U/L (ref 0–35)
AST: 18 U/L (ref 0–37)
Alkaline Phosphatase: 102 U/L (ref 39–117)
BUN: 10 mg/dL (ref 6–23)
CALCIUM: 8.8 mg/dL (ref 8.4–10.5)
CO2: 27 mEq/L (ref 19–32)
CREATININE: 0.73 mg/dL (ref 0.50–1.10)
Chloride: 106 mEq/L (ref 96–112)
GFR calc Af Amer: >90 mL/min (ref >90)
GFR calc non Af Amer: >90 mL/min (ref >90)
Glucose, Bld: 105 mg/dL — ABNORMAL HIGH (ref 70–99)
Potassium: 4.7 mEq/L (ref 3.7–5.3)
Sodium: 142 mEq/L (ref 137–147)
Total Bilirubin: 0.3 mg/dL (ref 0.3–1.2)
Total Protein: 6.8 g/dL (ref 6.0–8.3)

## 2014-03-28 LAB — CBC WITH DIFFERENTIAL/PLATELET
BASOS PCT: 0 % (ref 0–1)
Basophils Absolute: 0 10*3/uL (ref 0.0–0.1)
EOS ABS: 0.2 10*3/uL (ref 0.0–0.7)
Eosinophils Relative: 2 % (ref 0–5)
HEMATOCRIT: 43.1 % (ref 36.0–46.0)
Hemoglobin: 15 g/dL (ref 12.0–15.0)
Lymphocytes Relative: 26 % (ref 12–46)
Lymphs Abs: 2 10*3/uL (ref 0.7–4.0)
MCH: 31.3 pg (ref 26.0–34.0)
MCHC: 34.8 g/dL (ref 30.0–36.0)
MCV: 90 fL (ref 78.0–100.0)
MONO ABS: 0.6 10*3/uL (ref 0.1–1.0)
Monocytes Relative: 8 % (ref 3–12)
NEUTROS PCT: 64 % (ref 43–77)
Neutro Abs: 4.8 10*3/uL (ref 1.7–7.7)
Platelets: 160 10*3/uL (ref 150–400)
RBC: 4.79 MIL/uL (ref 3.87–5.11)
RDW: 13 % (ref 11.5–15.5)
WBC: 7.6 10*3/uL (ref 4.0–10.5)

## 2014-03-28 LAB — URINALYSIS, ROUTINE W REFLEX MICROSCOPIC
Bilirubin Urine: NEGATIVE
Glucose, UA: NEGATIVE mg/dL
HGB URINE DIPSTICK: NEGATIVE
Ketones, ur: NEGATIVE mg/dL
Leukocytes, UA: NEGATIVE
NITRITE: NEGATIVE
Protein, ur: NEGATIVE mg/dL
SPECIFIC GRAVITY, URINE: 1.01 (ref 1.005–1.030)
UROBILINOGEN UA: 0.2 mg/dL (ref 0.0–1.0)
pH: 7 (ref 5.0–8.0)

## 2014-03-28 LAB — LIPASE, BLOOD: LIPASE: 24 U/L (ref 11–59)

## 2014-03-28 LAB — PREGNANCY, URINE: PREG TEST UR: NEGATIVE

## 2014-03-28 MED ORDER — IBUPROFEN 100 MG/5ML PO SUSP
ORAL | Status: AC
  Filled 2014-03-28: qty 20

## 2014-03-28 NOTE — Discharge Instructions (Signed)

## 2014-03-28 NOTE — ED Notes (Signed)
Pain RUQ and rt mid abd, lasting app 40 min  No pain at present.  Had sl nausea when had pain. None now.  .  Has noticed  Increase in frequency of urination

## 2014-03-28 NOTE — ED Notes (Signed)
Sudden sharp pain right lower quad. Pain has since subsided.

## 2014-03-31 NOTE — ED Provider Notes (Signed)
CSN: [id number]     Arrival date & time 03/28/14  2008 History   First MD Initiated Contact with Patient 03/28/14 2030     Chief Complaint  Patient presents with  . Abdominal Pain     (Consider location/radiation/quality/duration/timing/severity/associated sxs/prior Treatment) Patient is a 38 y.o. female presenting with abdominal pain. The history is provided by the patient.  Abdominal Pain Pain location:  RLQ Pain quality: sharp and stabbing   Pain radiates to:  Does not radiate Pain severity:  No pain Onset quality:  Sudden Duration: approx 30 minutes. Timing:  Sporadic Progression:  Resolved Chronicity:  New Context: not alcohol use, not awakening from sleep, not diet changes, not eating, not laxative use, not previous surgeries, not recent illness, not recent sexual activity, not sick contacts, not suspicious food intake and not trauma   Relieved by: spontaneously resolved. Worsened by:  Nothing tried Ineffective treatments:  None tried Associated symptoms: no belching, no chest pain, no chills, no constipation, no cough, no diarrhea, no dysuria, no fever, no hematochezia, no hematuria, no melena, no nausea, no shortness of breath, no sore throat, no vaginal bleeding, no vaginal discharge and no vomiting   Risk factors: has not had multiple surgeries, no NSAID use and not pregnant     Past Medical History  Diagnosis Date  . UTI (lower urinary tract infection)   . Anemia   . Anxiety   . Hyperthyroidism   . GERD (gastroesophageal reflux disease)   . Graves disease    Past Surgical History  Procedure Laterality Date  . Ectopic pregnancy surgery    . Wisdom tooth extraction    . Tubal ligation  10/08/2011    Procedure: POST PARTUM TUBAL LIGATION;  Surgeon: Catalina AntiguaPeggy Constant, MD;  Location: [facility];  Service: Gynecology;  Laterality: Bilateral;   Family History  Problem Relation Age of Onset  . Diabetes Father   . Heart disease Father 6550    MI  . Heart disease Maternal  Grandmother    History  Substance Use Topics  . Smoking status: Current Every Day Smoker -- 1.00 packs/day    Types: Cigarettes  . Smokeless tobacco: Never Used  . Alcohol Use: Yes   OB History   Grav Para Term Preterm Abortions TAB SAB Ect Mult Living   4 2 2  2   2  2      Review of Systems  Constitutional: Negative for fever, chills and appetite change.  HENT: Negative for sore throat.   Respiratory: Negative for cough and shortness of breath.   Cardiovascular: Negative for chest pain.  Gastrointestinal: Positive for abdominal pain. Negative for nausea, vomiting, diarrhea, constipation, blood in stool, melena and hematochezia.  Genitourinary: Negative for dysuria, hematuria, flank pain, decreased urine volume, vaginal bleeding, vaginal discharge and difficulty urinating.  Musculoskeletal: Negative for back pain.  Skin: Negative for color change and rash.  Neurological: Negative for dizziness, weakness and numbness.  Hematological: Negative for adenopathy.  All other systems reviewed and are negative.     Allergies  Review of patient's allergies indicates no known allergies.  Home Medications   Prior to Admission medications   Medication Sig Start Date End Date Taking? Authorizing Provider  levothyroxine (SYNTHROID, LEVOTHROID) 100 MCG tablet Take 1 tablet (100 mcg total) by mouth daily. 12/08/13  Yes Salley ScarletKawanta F [place], MD  Vitamin D, Ergocalciferol, (DRISDOL) 50000 UNITS CAPS capsule Take 1 capsule (50,000 Units total) by mouth every 7 (seven) days. 12/14/13  Yes Salley ScarletKawanta F [place], MD  BP 146/103  Pulse 104  Temp(Src) 98 F (36.7 C) (Oral)  Resp 14  Ht 5\' 3"  (1.6 m)  Wt 117 lb (53.071 kg)  BMI 20.73 kg/m2  SpO2 100%  LMP 03/20/2014 Physical Exam  Nursing note and vitals reviewed. Constitutional: She is oriented to person, place, and time. She appears well-developed and well-nourished. No distress.  HENT:  Head: Normocephalic and atraumatic.  Mouth/Throat:  Oropharynx is clear and moist.  Cardiovascular: Normal rate, regular rhythm, normal heart sounds and intact distal pulses.   No murmur heard. Pulmonary/Chest: Effort normal and breath sounds normal. No respiratory distress.  Abdominal: Soft. Bowel sounds are normal. She exhibits no distension and no mass. There is no tenderness. There is no rebound and no guarding.  Entire abdomen is soft, NT w/o guarding or rebound tenderness.    Musculoskeletal: Normal range of motion. She exhibits no edema.  Neurological: She is alert and oriented to person, place, and time. She exhibits normal muscle tone. Coordination normal.  Skin: Skin is warm and dry.    ED Course  Procedures (including critical care time) Labs Review Labs Reviewed  URINALYSIS, ROUTINE W REFLEX MICROSCOPIC - Abnormal; Notable for the following:    Color, Urine STRAW (*)    All other components within normal limits  COMPREHENSIVE METABOLIC PANEL - Abnormal; Notable for the following:    Glucose, Bld 105 (*)    All other components within normal limits  PREGNANCY, URINE  CBC WITH DIFFERENTIAL  LIPASE, BLOOD    Imaging Review No results found.   EKG Interpretation None      MDM   Final diagnoses:  Right lower quadrant abdominal pain   Pt with sudden, brief RLQ pain that spontaneously resolved prior to ED arrival.  No concerning sx's for acute abdomen at this time.  Labs are reassuring.  I have advised pt of possible early appendicitis and she agrees to return here immediately if any worsening sx's develop.  She has been talking and laughing with family members at bedside and appears stable for d/c.      Tammy L. Trisha Mangleriplett, PA-C 03/31/14 1914

## 2014-04-01 NOTE — ED Provider Notes (Signed)
Medical screening examination/treatment/procedure(s) were performed by non-physician practitioner and as supervising physician I was immediately available for consultation/collaboration.   EKG Interpretation None      Cyntha Brickman, MD, FACEP   Brighten Orndoff L Kyri Dai, MD 04/01/14 0945 

## 2014-04-10 ENCOUNTER — Encounter: Payer: Self-pay | Admitting: Cardiology

## 2014-04-10 ENCOUNTER — Other Ambulatory Visit (HOSPITAL_COMMUNITY): Payer: Self-pay | Admitting: Physician Assistant

## 2014-04-10 DIAGNOSIS — R1031 Right lower quadrant pain: Secondary | ICD-10-CM

## 2014-04-12 ENCOUNTER — Ambulatory Visit (HOSPITAL_COMMUNITY): Admission: RE | Admit: 2014-04-12 | Payer: BC Managed Care – PPO | Source: Ambulatory Visit

## 2014-04-13 ENCOUNTER — Ambulatory Visit (HOSPITAL_COMMUNITY)
Admission: RE | Admit: 2014-04-13 | Discharge: 2014-04-13 | Disposition: A | Discharge status: Home / Self Care | Payer: BC Managed Care – PPO | Source: Ambulatory Visit | Attending: Physician Assistant | Admitting: Physician Assistant

## 2014-04-13 ENCOUNTER — Encounter (HOSPITAL_COMMUNITY): Payer: Self-pay

## 2014-04-13 DIAGNOSIS — R1031 Right lower quadrant pain: Secondary | ICD-10-CM | POA: Insufficient documentation

## 2014-04-13 DIAGNOSIS — N200 Calculus of kidney: Secondary | ICD-10-CM | POA: Insufficient documentation

## 2014-04-13 MED ORDER — IOHEXOL 300 MG/ML  SOLN
100.0000 mL | Freq: Once | INTRAMUSCULAR | Status: AC | PRN
  Administered 2014-04-13: 100 mL via INTRAVENOUS

## 2014-05-29 ENCOUNTER — Other Ambulatory Visit: Payer: BC Managed Care – PPO | Admitting: Obstetrics and Gynecology

## 2014-08-07 ENCOUNTER — Encounter (HOSPITAL_COMMUNITY): Payer: Self-pay

## 2015-01-04 ENCOUNTER — Other Ambulatory Visit: Payer: Self-pay

## 2015-01-04 ENCOUNTER — Ambulatory Visit (INDEPENDENT_AMBULATORY_CARE_PROVIDER_SITE_OTHER): Payer: BC Managed Care – PPO | Admitting: Cardiology

## 2015-01-04 ENCOUNTER — Encounter: Payer: Self-pay | Admitting: Cardiology

## 2015-01-04 VITALS — BP 112/82 | HR 96 | Ht 63.0 in | Wt 122.0 lb

## 2015-01-04 DIAGNOSIS — R002 Palpitations: Secondary | ICD-10-CM

## 2015-01-04 DIAGNOSIS — R9431 Abnormal electrocardiogram [ECG] [EKG]: Secondary | ICD-10-CM

## 2015-01-04 NOTE — Patient Instructions (Signed)
Your physician has requested that you have an echocardiogram. Echocardiography is a painless test that uses sound waves to create images of your heart. It provides your doctor with information about the size and shape of your heart and how well your heart's chambers and valves are working. This procedure takes approximately one hour. There are no restrictions for this procedure.  Your physician has recommended that you wear an event monitor. Event monitors are medical devices that record the heart's electrical activity. Doctors most often us these monitors to diagnose arrhythmias. Arrhythmias are problems with the speed or rhythm of the heartbeat. The monitor is a small, portable device. You can wear one while you do your normal daily activities. This is usually used to diagnose what is causing palpitations/syncope (passing out).  Your physician recommends that you schedule a follow-up appointment AS NEEDED with Dr. Mayford Knifeurner pending your study results.

## 2015-01-04 NOTE — Progress Notes (Signed)
Cardiology Office Note   Date:  01/04/2015   ID:  Kristin [name], DOB 10-18-1975, MRN [id number]  PCP:  Cassell Smiles., MD    No chief complaint on file.     History of Present Illness: Kristin [name] is a 39 y.o. female who presents for evaluation of palpitations.  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  She got radioactive iodine therapy and is now on Synthroid.  She says that the palpitations occur erratically.  It may be a few months before she gets an episode and and then have several.  She occasionally will get dizzy with the palpitations but denies any syncope.  It is nonexertional.  She denies any chest pain, SOB, PND or orthpnea or LE edema.      Past Medical History  Diagnosis Date  . UTI (lower urinary tract infection)   . Anemia   . Anxiety   . Hyperthyroidism   . GERD (gastroesophageal reflux disease)   . Graves disease   . Hypertension   . Anxiety   . Thyrotoxicosis     Past Surgical History  Procedure Laterality Date  . Ectopic pregnancy surgery    . Wisdom tooth extraction    . Tubal ligation  10/08/2011    Procedure: POST PARTUM TUBAL LIGATION;  Surgeon: Catalina Antigua, MD;  Location: [facility];  Service: Gynecology;  Laterality: Bilateral;     Current Outpatient Prescriptions  Medication Sig Dispense Refill  . ibuprofen (ADVIL,MOTRIN) 200 MG tablet Take 200 mg by mouth every 8 (eight) hours as needed (PAIN).     Marland Kitchen levothyroxine (SYNTHROID, LEVOTHROID) 75 MCG tablet Take 1 tablet by mouth daily.  11  . Polyethyl Glycol-Propyl Glycol (SYSTANE OP) Apply 1-2 drops to eye 2 (two) times daily.     No current facility-administered medications for this visit.    Allergies:   Review of patient's allergies indicates no known allergies.    Social History:  The patient  reports that she has been smoking Cigarettes.  She has been smoking about 1.00 pack per day. She has never  used smokeless tobacco. She reports that she drinks alcohol. She reports that she does not use illicit drugs.   Family History:  The patient's family history includes Diabetes in her father; Heart disease in her maternal grandmother; Heart disease (age of onset: 6) in her father.    ROS:  Please see the history of present illness.   Otherwise, review of systems are positive for none.   All other systems are reviewed and negative.    PHYSICAL EXAM: VS:  BP 112/82 mmHg  Pulse 96  Ht  (1.6 m)  Wt 122 lb (55.339 kg)  BMI 21.62 kg/m2  LMP 01/03/2015 , BMI Body mass index is 21.62 kg/(m^2). GEN: Well nourished, well developed, in no acute distress HEENT: normal Neck: no JVD, carotid bruits, or masses Cardiac: RRR; no murmurs, rubs, or gallops,no edema  Respiratory:  clear to auscultation bilaterally, normal work of breathing GI: soft, nontender, nondistended, + BS MS: no deformity or atrophy Skin: warm and dry, no rash Neuro:  Strength and sensation are intact Psych: euthymic mood, full affect   EKG:  EKG is ordered today. The ekg ordered today demonstrates NSR at 96bpm with rSR' in V1 and V2   Recent Labs: 03/28/2014: ALT 17; BUN 10; Creatinine 0.73; Hemoglobin 15.0; Platelets 160; Potassium 4.7; Sodium 142  Lipid Panel    Component Value Date/Time   CHOL 150 12/13/2013 0956   TRIG 68 12/13/2013 0956   HDL 43 12/13/2013 0956   CHOLHDL 3.5 12/13/2013 0956   VLDL 14 12/13/2013 0956   LDLCALC 93 12/13/2013 0956      Wt Readings from Last 3 Encounters:  01/04/15 122 lb (55.339 kg)  03/28/14 117 lb (53.071 kg)  12/13/13 122 lb (55.339 kg)        ASSESSMENT AND PLAN:  1.  Palpitations of unknown etiology.  I will get an event monitor since her palpitations are fairly infrequent.  She does drink soda but does not notice that palpitations are worse when she drinks it.  She does not take any OTC cold meds.      2.  Abnormal EKG with IRBBB.  I will get a 2D echo to  rule out structural heart disease.  3.  Graves disease   Current medicines are reviewed at length with the patient today.  The patient does not have concerns regarding medicines.  The following changes have been made:  no change  Labs/ tests ordered today include: see above assessment and plan No orders of the defined types were placed in this encounter.     Disposition:   FU with me PRN pending results of studies  SignedQuintella Reichert, TURNER,TRACI R, MD  01/04/2015 11:42 AM    [facility] [address], [place], [place]  [address] Phone: [phone]; Fax: [phone]

## 2015-01-11 ENCOUNTER — Ambulatory Visit (HOSPITAL_COMMUNITY): Payer: BC Managed Care – PPO | Attending: Cardiology | Admitting: Radiology

## 2015-01-11 ENCOUNTER — Encounter (INDEPENDENT_AMBULATORY_CARE_PROVIDER_SITE_OTHER): Payer: BC Managed Care – PPO

## 2015-01-11 ENCOUNTER — Encounter: Payer: Self-pay | Admitting: *Deleted

## 2015-01-11 DIAGNOSIS — R002 Palpitations: Secondary | ICD-10-CM | POA: Diagnosis present

## 2015-01-11 DIAGNOSIS — R9431 Abnormal electrocardiogram [ECG] [EKG]: Secondary | ICD-10-CM | POA: Diagnosis not present

## 2015-01-11 NOTE — Progress Notes (Signed)
Echocardiogram performed.  

## 2015-01-11 NOTE — Progress Notes (Signed)
Patient ID: Kristin [name], female   DOB: 10/14/1975, 39 y.o.   MRN: [id number] [employer] 30 day cardiac event monitor applied to patient.

## 2015-02-01 ENCOUNTER — Telehealth: Payer: Self-pay | Admitting: Cardiology

## 2015-02-01 NOTE — Telephone Encounter (Signed)
New message      Pt is wearing an event monitor.  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  If any problems, please call.

## 2015-02-01 NOTE — Telephone Encounter (Signed)
Per Tereso NewcomerScott Weaver, OK to write letter stating it is OK to work.

## 2015-02-01 NOTE — Telephone Encounter (Signed)
Ok to send a letter stating patient can work while wearing monitor

## 2015-02-01 NOTE — Telephone Encounter (Signed)
F/u   Pt calling b/c her employer is needing the letter fax stating she can still perform her job and if she don't get it she can't work. Please advise pt.

## 2015-02-13 ENCOUNTER — Telehealth: Payer: Self-pay | Admitting: Cardiology

## 2015-02-13 NOTE — Telephone Encounter (Signed)
Left message to call back  

## 2015-02-13 NOTE — Telephone Encounter (Signed)
Please let patient know that heart monitor showed NSR with PAC's which are benign

## 2015-02-14 ENCOUNTER — Telehealth: Payer: Self-pay | Admitting: Cardiology

## 2015-02-14 NOTE — Telephone Encounter (Signed)
Informed patient of the notation and results by Dr. Mayford Knifeurner from her Event Monitor. Patient expressed being relieved and appreciated the prompt call back. Patient wants to know if she needs to do anything else or since the ECHO showed "trivial TR" will she need to be monitored at intervals? Will route question to Dr. Mayford Knifeurner.

## 2015-02-14 NOTE — Telephone Encounter (Signed)
No it was trivial and physiologic so no monitoring needed

## 2015-02-14 NOTE — Telephone Encounter (Signed)
New Message ° ° °Patient is returning nurses call. Please give patient a call back.  °

## 2015-02-15 NOTE — Telephone Encounter (Signed)
Left message to call back  

## 2015-02-15 NOTE — Telephone Encounter (Signed)
See other phone note

## 2015-02-19 NOTE — Telephone Encounter (Signed)
Informed patient her TR is trivial and physiologic so no monitoring is necessary. Patient is thankful for callback.

## 2015-08-13 ENCOUNTER — Other Ambulatory Visit (HOSPITAL_COMMUNITY): Payer: Self-pay | Admitting: Internal Medicine

## 2015-08-13 DIAGNOSIS — Z1231 Encounter for screening mammogram for malignant neoplasm of breast: Secondary | ICD-10-CM

## 2015-08-20 ENCOUNTER — Ambulatory Visit (HOSPITAL_COMMUNITY)
Admission: RE | Admit: 2015-08-20 | Discharge: 2015-08-20 | Disposition: A | Discharge status: Home / Self Care | Payer: BC Managed Care – PPO | Source: Ambulatory Visit | Attending: Internal Medicine | Admitting: Internal Medicine

## 2015-08-20 DIAGNOSIS — Z1231 Encounter for screening mammogram for malignant neoplasm of breast: Secondary | ICD-10-CM | POA: Diagnosis not present

## 2016-02-21 ENCOUNTER — Encounter (HOSPITAL_COMMUNITY): Payer: Self-pay

## 2016-02-21 ENCOUNTER — Emergency Department (HOSPITAL_COMMUNITY)
Admission: EM | Admit: 2016-02-21 | Discharge: 2016-02-22 | Disposition: A | Discharge status: Home / Self Care | Payer: BC Managed Care – PPO | Attending: Emergency Medicine | Admitting: Emergency Medicine

## 2016-02-21 DIAGNOSIS — R531 Weakness: Secondary | ICD-10-CM | POA: Diagnosis not present

## 2016-02-21 DIAGNOSIS — I10 Essential (primary) hypertension: Secondary | ICD-10-CM | POA: Diagnosis not present

## 2016-02-21 DIAGNOSIS — E0590 Thyrotoxicosis, unspecified without thyrotoxic crisis or storm: Secondary | ICD-10-CM | POA: Insufficient documentation

## 2016-02-21 DIAGNOSIS — R079 Chest pain, unspecified: Secondary | ICD-10-CM

## 2016-02-21 DIAGNOSIS — R002 Palpitations: Secondary | ICD-10-CM | POA: Diagnosis present

## 2016-02-21 DIAGNOSIS — R0789 Other chest pain: Secondary | ICD-10-CM | POA: Insufficient documentation

## 2016-02-21 DIAGNOSIS — R0602 Shortness of breath: Secondary | ICD-10-CM | POA: Diagnosis not present

## 2016-02-21 DIAGNOSIS — F419 Anxiety disorder, unspecified: Secondary | ICD-10-CM | POA: Diagnosis not present

## 2016-02-21 DIAGNOSIS — F17210 Nicotine dependence, cigarettes, uncomplicated: Secondary | ICD-10-CM | POA: Insufficient documentation

## 2016-02-21 NOTE — ED Notes (Signed)
Pt states she feels like her heart has been flip flopping at times, states she also feels a little sob and anxious.

## 2016-02-22 ENCOUNTER — Emergency Department (HOSPITAL_COMMUNITY): Payer: BC Managed Care – PPO

## 2016-02-22 LAB — CBC WITH DIFFERENTIAL/PLATELET
BASOS ABS: 0 10*3/uL (ref 0.0–0.1)
Basophils Relative: 0 %
Eosinophils Absolute: 0.2 10*3/uL (ref 0.0–0.7)
Eosinophils Relative: 2 %
HEMATOCRIT: 40.3 % (ref 36.0–46.0)
Hemoglobin: 13.8 g/dL (ref 12.0–15.0)
Lymphocytes Relative: 26 %
Lymphs Abs: 2.1 10*3/uL (ref 0.7–4.0)
MCH: 30.5 pg (ref 26.0–34.0)
MCHC: 34.2 g/dL (ref 30.0–36.0)
MCV: 89.2 fL (ref 78.0–100.0)
MONO ABS: 0.7 10*3/uL (ref 0.1–1.0)
Monocytes Relative: 9 %
NEUTROS ABS: 5.1 10*3/uL (ref 1.7–7.7)
Neutrophils Relative %: 63 %
Platelets: 155 10*3/uL (ref 150–400)
RBC: 4.52 MIL/uL (ref 3.87–5.11)
RDW: 13.3 % (ref 11.5–15.5)
WBC: 8.2 10*3/uL (ref 4.0–10.5)

## 2016-02-22 LAB — BASIC METABOLIC PANEL
Anion gap: 7 (ref 5–15)
BUN: 11 mg/dL (ref 6–20)
CALCIUM: 9 mg/dL (ref 8.9–10.3)
CO2: 21 mmol/L — ABNORMAL LOW (ref 22–32)
Chloride: 109 mmol/L (ref 101–111)
Creatinine, Ser: 0.63 mg/dL (ref 0.44–1.00)
GFR calc Af Amer: >60 mL/min (ref >60)
Glucose, Bld: 125 mg/dL — ABNORMAL HIGH (ref 65–99)
Potassium: 3.7 mmol/L (ref 3.5–5.1)
SODIUM: 137 mmol/L (ref 135–145)

## 2016-02-22 LAB — TROPONIN I

## 2016-02-22 LAB — TSH: TSH: 2.122 u[IU]/mL (ref 0.350–4.500)

## 2016-02-22 LAB — MAGNESIUM: Magnesium: 2.1 mg/dL (ref 1.7–2.4)

## 2016-02-22 LAB — POC URINE PREG, ED: PREG TEST UR: NEGATIVE

## 2016-02-22 MED ORDER — SODIUM CHLORIDE 0.9 % IV BOLUS (SEPSIS)
1000.0000 mL | Freq: Once | INTRAVENOUS | Status: AC
  Administered 2016-02-22: 1000 mL via INTRAVENOUS

## 2016-02-22 NOTE — ED Provider Notes (Signed)
TIME SEEN: 1:25 AM  CHIEF COMPLAINT: Palpitations, chest tightness, shortness of breath  HPI: Pt is a 40 y.o. female with history of Graves' disease, hypertension, anxiety who presents to the emergency department with palpitations that started tonight while watching television. States that it made her very anxious and she started having diffuse chest tightness without radiation, shortness of breath. No diaphoresis, dizziness, nausea or vomiting. States performed that she has been feeling well. No recent fevers, cough, vomiting or diarrhea, bloody stool or melena. States she feels like her heart is "fluttering" or "skipping a beat". Has had similar symptoms in the past or she had to wear a Holter monitor and had an echocardiogram in April 2016 which was normal.  No history of PE, DVT, exogenous estrogen use, fracture, surgery, trauma, hospitalization, prolonged travel. No lower extremity swelling or pain. No calf tenderness.   ROS: See HPI Constitutional: no fever  Eyes: no drainage  ENT: no runny nose   Cardiovascular:  no chest pain  Resp: no SOB  GI: no vomiting GU: no dysuria Integumentary: no rash  Allergy: no hives  Musculoskeletal: no leg swelling  Neurological: no slurred speech ROS otherwise negative  PAST MEDICAL HISTORY/PAST SURGICAL HISTORY:  Past Medical History  Diagnosis Date  . UTI (lower urinary tract infection)   . Anemia   . Anxiety   . Hyperthyroidism   . GERD (gastroesophageal reflux disease)   . Graves disease   . Hypertension   . Anxiety   . Thyrotoxicosis   . Graves disease     MEDICATIONS:  Prior to Admission medications   Medication Sig Start Date End Date Taking? Authorizing Provider  ALPRAZolam Prudy Feeler) 0.25 MG tablet Take 0.25 mg by mouth at bedtime as needed for anxiety.   Yes Historical Provider, MD  ibuprofen (ADVIL,MOTRIN) 200 MG tablet Take 200 mg by mouth every 8 (eight) hours as needed (PAIN).    Yes Historical Provider, MD  levothyroxine  (SYNTHROID, LEVOTHROID) 112 MCG tablet Take 112 mcg by mouth daily before breakfast.   Yes Historical Provider, MD  Polyethyl Glycol-Propyl Glycol (SYSTANE OP) Apply 1-2 drops to eye 2 (two) times daily.   Yes Historical Provider, MD  levothyroxine (SYNTHROID, LEVOTHROID) 75 MCG tablet Take 1 tablet by mouth daily. 12/26/14   Historical Provider, MD    ALLERGIES:  No Known Allergies  SOCIAL HISTORY:  Social History  Substance Use Topics  . Smoking status: Current Every Day Smoker -- 1.00 packs/day    Types: Cigarettes  . Smokeless tobacco: Never Used  . Alcohol Use: No     Comment: social    FAMILY HISTORY: Family History  Problem Relation Age of Onset  . Diabetes Father   . Heart disease Father 66    MI  . Heart disease Maternal Grandmother     EXAM: BP 120/75 mmHg  Pulse 69  Temp(Src) 98.7 F (37.1 C) (Oral)  Resp 12  Ht  (1.6 m)  Wt 130 lb (58.968 kg)  BMI 23.03 kg/m2  SpO2 100%  LMP 02/04/2016 CONSTITUTIONAL: Alert and oriented and responds appropriately to questions. Well-appearing; well-nourished HEAD: Normocephalic EYES: Conjunctivae clear, PERRL ENT: normal nose; no rhinorrhea; moist mucous membranes NECK: Supple, no meningismus, no LAD  CARD: RRR; S1 and S2 appreciated; no murmurs, no clicks, no rubs, no gallops RESP: Normal chest excursion without splinting or tachypnea; breath sounds clear and equal bilaterally; no wheezes, no rhonchi, no rales, no hypoxia or respiratory distress, speaking full sentences ABD/GI: Normal bowel sounds;  non-distended; soft, non-tender, no rebound, no guarding, no peritoneal signs BACK:  The back appears normal and is non-tender to palpation, there is no CVA tenderness EXT: Normal ROM in all joints; non-tender to palpation; no edema; normal capillary refill; no cyanosis, no calf tenderness or swelling    SKIN: Normal color for age and race; warm; no rash NEURO: Moves all extremities equally, sensation to light touch intact  diffusely, cranial nerves II through XII intact PSYCH: The patient's mood and manner are appropriate. Grooming and personal hygiene are appropriate.  MEDICAL DECISION MAKING: Patient here with palpitations. Has had palpitations before and has worn a Holter monitor. Recently had an echocardiogram a year ago which was normal. No history of coronary artery disease. No history of PE or DVT.  States after her palpitations started she did have some chest tightness and shortness of breath which she thinks was her anxiety. This is gone currently. She is having occasional PACs on the monitor. EKG shows no other acute abnormality. Basic labs have been ordered and show normal hemoglobin, normal potassium, normal TSH. While on troponin, chest x-ray, urine pregnancy test. We'll give IV fluids and reassess. She denies any illicit drug use, increased caffeine intake, changes in medications. States she has had increased stress recently.  ED PROGRESS: Patient's magnesium level is normal. Troponin negative. She is not pregnant. Chest x-ray clear. Still a symptomatic and hemodynamic is stable. I recommend outpatient cardiology follow-up if she continues to have palpitations. She has not had any events on the cardiac monitor while in the emergency department other than occasional premature atrial contractions. Discussed return precautions. Doubt ACS, PE. Patient and family are comfortable with this plan.   At this time, I do not feel there is any life-threatening condition present. I have reviewed and discussed all results (EKG, imaging, lab, urine as appropriate), exam findings with patient. I have reviewed nursing notes and appropriate previous records.  I feel the patient is safe to be discharged home without further emergent workup. Discussed usual and customary return precautions. Patient and family (if present) verbalize understanding and are comfortable with this plan.  Patient will follow-up with their primary care  provider. If they do not have a primary care provider, information for follow-up has been provided to them. All questions have been answered.     EKG Interpretation  Date/Time:  Thursday Feb 21 2016 23:54:05 EDT Ventricular Rate:  81 PR Interval:  136 QRS Duration: 106 QT Interval:  382 QTC Calculation: 443 R Axis:   93 Text Interpretation:  Sinus rhythm Consider right ventricular hypertrophy No significant change since last tracing other than rate is slower Confirmed by Eon Zunker,  DO, Chaston Bradburn [phone]([id number] on 02/21/2016 11:57:58 PM        Layla MawKristen N Gregrey Bloyd, DO 02/22/16 [id number]

## 2016-02-22 NOTE — Discharge Instructions (Signed)
Nonspecific Chest Pain  °Chest pain can be caused by many different conditions. There is always a chance that your pain could be related to something serious, such as a heart attack or a blood clot in your lungs. Chest pain can also be caused by conditions that are not life-threatening. If you have chest pain, it is very important to follow up with your health care provider. °CAUSES  °Chest pain can be caused by: °· Heartburn. °· Pneumonia or bronchitis. °· Anxiety or stress. °· Inflammation around your heart (pericarditis) or lung (pleuritis or pleurisy). °· A blood clot in your lung. °· A collapsed lung (pneumothorax). It can develop suddenly on its own (spontaneous pneumothorax) or from trauma to the chest. °· Shingles infection (varicella-zoster virus). °· Heart attack. °· Damage to the bones, muscles, and cartilage that make up your chest wall. This can include: °· Bruised bones due to injury. °· Strained muscles or cartilage due to frequent or repeated coughing or overwork. °· Fracture to one or more ribs. °· Sore cartilage due to inflammation (costochondritis). °RISK FACTORS  °Risk factors for chest pain may include: °· Activities that increase your risk for trauma or injury to your chest. °· Respiratory infections or conditions that cause frequent coughing. °· Medical conditions or overeating that can cause heartburn. °· Heart disease or family history of heart disease. °· Conditions or health behaviors that increase your risk of developing a blood clot. °· Having had chicken pox (varicella zoster). °SIGNS AND SYMPTOMS °Chest pain can feel like: °· Burning or tingling on the surface of your chest or deep in your chest. °· Crushing, pressure, aching, or squeezing pain. °· Dull or sharp pain that is worse when you move, cough, or take a deep breath. °· Pain that is also felt in your back, neck, shoulder, or arm, or pain that spreads to any of these areas. °Your chest pain may come and go, or it may stay  constant. °DIAGNOSIS °Lab tests or other studies may be needed to find the cause of your pain. Your health care provider may have you take a test called an ambulatory ECG (electrocardiogram). An ECG records your heartbeat patterns at the time the test is performed. You may also have other tests, such as: °· Transthoracic echocardiogram (TTE). During echocardiography, sound waves are used to create a picture of all of the heart structures and to look at how blood flows through your heart. °· Transesophageal echocardiogram (TEE). This is a more advanced imaging test that obtains images from inside your body. It allows your health care provider to see your heart in finer detail. °· Cardiac monitoring. This allows your health care provider to monitor your heart rate and rhythm in real time. °· Holter monitor. This is a portable device that records your heartbeat and can help to diagnose abnormal heartbeats. It allows your health care provider to track your heart activity for several days, if needed. °· Stress tests. These can be done through exercise or by taking medicine that makes your heart beat more quickly. °· Blood tests. °· Imaging tests. °TREATMENT  °Your treatment depends on what is causing your chest pain. Treatment may include: °· Medicines. These may include: °· Acid blockers for heartburn. °· Anti-inflammatory medicine. °· Pain medicine for inflammatory conditions. °· Antibiotic medicine, if an infection is present. °· Medicines to dissolve blood clots. °· Medicines to treat coronary artery disease. °· Supportive care for conditions that do not require medicines. This may include: °· Resting. °· Applying heat   or cold packs to injured areas. °· Limiting activities until pain decreases. °HOME CARE INSTRUCTIONS °· If you were prescribed an antibiotic medicine, finish it all even if you start to feel better. °· Avoid any activities that bring on chest pain. °· Do not use any tobacco products, including  cigarettes, chewing tobacco, or electronic cigarettes. If you need help quitting, ask your health care provider. °· Do not drink alcohol. °· Take medicines only as directed by your health care provider. °· Keep all follow-up visits as directed by your health care provider. This is important. This includes any further testing if your chest pain does not go away. °· If heartburn is the cause for your chest pain, you may be told to keep your head raised (elevated) while sleeping. This reduces the chance that acid will go from your stomach into your esophagus. °· Make lifestyle changes as directed by your health care provider. These may include: °· Getting regular exercise. Ask your health care provider to suggest some activities that are safe for you. °· Eating a heart-healthy diet. A registered dietitian can help you to learn healthy eating options. °· Maintaining a healthy weight. °· Managing diabetes, if necessary. °· Reducing stress. °SEEK MEDICAL CARE IF: °· Your chest pain does not go away after treatment. °· You have a rash with blisters on your chest. °· You have a fever. °SEEK IMMEDIATE MEDICAL CARE IF:  °· Your chest pain is worse. °· You have an increasing cough, or you cough up blood. °· You have severe abdominal pain. °· You have severe weakness. °· You faint. °· You have chills. °· You have sudden, unexplained chest discomfort. °· You have sudden, unexplained discomfort in your arms, back, neck, or jaw. °· You have shortness of breath at any time. °· You suddenly start to sweat, or your skin gets clammy. °· You feel nauseous or you vomit. °· You suddenly feel light-headed or dizzy. °· Your heart begins to beat quickly, or it feels like it is skipping beats. °These symptoms may represent a serious problem that is an emergency. Do not wait to see if the symptoms will go away. Get medical help right away. Call your local emergency services (911 in the [place] Do not drive yourself to the hospital. °  °This  information is not intended to replace advice given to you by your health care provider. Make sure you discuss any questions you have with your health care provider. °  °Document Released: 07/02/2005 Document Revised: 10/13/2014 Document Reviewed: 04/28/2014 °Elsevier Interactive Patient Education ©2016 [employer] Inc[employer] ° °Palpitations °A palpitation is the feeling that your heartbeat is irregular or is faster than normal. It may feel like your heart is fluttering or skipping a beat. Palpitations are usually not a serious problem. However, in some cases, you may need further medical evaluation. °CAUSES  °Palpitations can be caused by: °· Smoking. °· Caffeine or other stimulants, such as diet pills or energy drinks. °· Alcohol. °· Stress and anxiety. °· Strenuous physical activity. °· Fatigue. °· Certain medicines. °· Heart disease, especially if you have a history of irregular heart rhythms (arrhythmias), such as atrial fibrillation, atrial flutter, or supraventricular tachycardia. °· An improperly working pacemaker or defibrillator. °DIAGNOSIS  °To find the cause of your palpitations, your health care provider will take your medical history and perform a physical exam. Your health care provider may also have you take a test called an ambulatory electrocardiogram (ECG). An ECG records your heartbeat patterns over a 24-hour   period. You may also have other tests, such as: °· Transthoracic echocardiogram (TTE). During echocardiography, sound waves are used to evaluate how blood flows through your heart. °· Transesophageal echocardiogram (TEE). °· Cardiac monitoring. This allows your health care provider to monitor your heart rate and rhythm in real time. °· Holter monitor. This is a portable device that records your heartbeat and can help diagnose heart arrhythmias. It allows your health care provider to track your heart activity for several days, if needed. °· Stress tests by exercise or by giving medicine that makes the  heart beat faster. °TREATMENT  °Treatment of palpitations depends on the cause of your symptoms and can vary greatly. Most cases of palpitations do not require any treatment other than time, relaxation, and monitoring your symptoms. Other causes, such as atrial fibrillation, atrial flutter, or supraventricular tachycardia, usually require further treatment. °HOME CARE INSTRUCTIONS  °· Avoid: °¨ Caffeinated coffee, tea, soft drinks, diet pills, and energy drinks. °¨ Chocolate. °¨ Alcohol. °· Stop smoking if you smoke. °· Reduce your stress and anxiety. Things that can help you relax include: °¨ A method of controlling things in your body, such as your heartbeats, with your mind (biofeedback). °¨ Yoga. °¨ Meditation. °¨ Physical activity such as swimming, jogging, or walking. °· Get plenty of rest and sleep. °SEEK MEDICAL CARE IF:  °· You continue to have a fast or irregular heartbeat beyond 24 hours. °· Your palpitations occur more often. °SEEK IMMEDIATE MEDICAL CARE IF: °· You have chest pain or shortness of breath. °· You have a severe headache. °· You feel dizzy or you faint. °MAKE SURE YOU: °· Understand these instructions. °· Will watch your condition. °· Will get help right away if you are not doing well or get worse. °  °This information is not intended to replace advice given to you by your health care provider. Make sure you discuss any questions you have with your health care provider. °  °Document Released: 09/19/2000 Document Revised: 09/27/2013 Document Reviewed: 11/21/2011 °Elsevier Interactive Patient Education ©2016 Elsevier Inc. ° °

## 2016-02-22 NOTE — ED Notes (Signed)
Pt states that she was at home when she started noticing what felt like her heart having irregular beats, admits to  Pain that is described as a tightness, non radiating, is associated with sob, weakness and anxiety, pt reports that she took two xanax prior to arrival in er,

## 2016-06-03 ENCOUNTER — Ambulatory Visit: Payer: BC Managed Care – PPO | Admitting: Obstetrics and Gynecology

## 2016-08-29 ENCOUNTER — Encounter (HOSPITAL_COMMUNITY): Payer: Self-pay | Admitting: *Deleted

## 2016-08-29 ENCOUNTER — Emergency Department (HOSPITAL_COMMUNITY)
Admission: EM | Admit: 2016-08-29 | Discharge: 2016-08-29 | Disposition: A | Discharge status: Home / Self Care | Payer: BC Managed Care – PPO | Attending: Emergency Medicine | Admitting: Emergency Medicine

## 2016-08-29 DIAGNOSIS — I10 Essential (primary) hypertension: Secondary | ICD-10-CM | POA: Diagnosis not present

## 2016-08-29 DIAGNOSIS — Z791 Long term (current) use of non-steroidal anti-inflammatories (NSAID): Secondary | ICD-10-CM | POA: Insufficient documentation

## 2016-08-29 DIAGNOSIS — L02415 Cutaneous abscess of right lower limb: Secondary | ICD-10-CM | POA: Diagnosis not present

## 2016-08-29 DIAGNOSIS — E039 Hypothyroidism, unspecified: Secondary | ICD-10-CM | POA: Insufficient documentation

## 2016-08-29 DIAGNOSIS — Z79899 Other long term (current) drug therapy: Secondary | ICD-10-CM | POA: Insufficient documentation

## 2016-08-29 DIAGNOSIS — F17210 Nicotine dependence, cigarettes, uncomplicated: Secondary | ICD-10-CM | POA: Insufficient documentation

## 2016-08-29 DIAGNOSIS — L0291 Cutaneous abscess, unspecified: Secondary | ICD-10-CM

## 2016-08-29 MED ORDER — SULFAMETHOXAZOLE-TRIMETHOPRIM 800-160 MG PO TABS
1.0000 | ORAL_TABLET | Freq: Once | ORAL | Status: AC
  Administered 2016-08-29: 1 via ORAL
  Filled 2016-08-29: qty 1

## 2016-08-29 MED ORDER — SULFAMETHOXAZOLE-TRIMETHOPRIM 800-160 MG PO TABS
1.0000 | ORAL_TABLET | Freq: Two times a day (BID) | ORAL | 0 refills | Status: AC
Start: 2016-08-29 — End: 2016-09-05

## 2016-08-29 NOTE — ED Provider Notes (Signed)
[facility]Y DEPT Provider Note   CSN: [id number] Arrival date & time: 08/29/16  2022     History   Chief Complaint Chief Complaint  Patient presents with  . Abscess    HPI Cindi CarbonKeesha M Wacker is a 40 y.o. female.  The history is provided by the patient. No language interpreter was used.  Abscess  Location:  Leg Leg abscess location:  R upper leg Size:  2 Abscess quality: draining, redness and warmth   Red streaking: no   Progression:  Worsening Chronicity:  New Context: not diabetes   Relieved by:  Warm water soaks Worsened by:  Nothing Ineffective treatments:  None tried Associated symptoms: no anorexia, no fever and no nausea   Pt complains of a draining abscess right inner thigh.   Past Medical History:  Diagnosis Date  . Anemia   . Anxiety   . Anxiety   . GERD (gastroesophageal reflux disease)   . Graves disease   . Graves disease   . Hypertension   . Hyperthyroidism   . Thyrotoxicosis   . UTI (lower urinary tract infection)     Patient Active Problem List   Diagnosis Date Noted  . Tobacco use disorder 12/13/2013  . Family history of early CAD 12/13/2013  . Hypothyroidism (acquired) 11/20/2013  . Anxiety   . GERD (gastroesophageal reflux disease)   . S/P tubal ligation 10/09/2011  . Supervision of normal intrauterine pregnancy in multigravida 10/08/2011  . Anemia 07/03/2011    Past Surgical History:  Procedure Laterality Date  . ECTOPIC PREGNANCY SURGERY    . TUBAL LIGATION  10/08/2011   Procedure: POST PARTUM TUBAL LIGATION;  Surgeon: Catalina AntiguaPeggy Constant, MD;  Location: [facility];  Service: Gynecology;  Laterality: Bilateral;  . WISDOM TOOTH EXTRACTION      OB History    Gravida Para Term Preterm AB Living   4 2 2   2 2    SAB TAB Ectopic Multiple Live Births       2   1       Home Medications    Prior to Admission medications   Medication Sig Start Date End Date Taking? Authorizing Provider  ALPRAZolam Prudy Feeler(XANAX) 0.25 MG tablet Take 0.25 mg by  mouth at bedtime as needed for anxiety.    Historical Provider, MD  ibuprofen (ADVIL,MOTRIN) 200 MG tablet Take 200 mg by mouth every 8 (eight) hours as needed (PAIN).     Historical Provider, MD  levothyroxine (SYNTHROID, LEVOTHROID) 112 MCG tablet Take 112 mcg by mouth daily before breakfast.    Historical Provider, MD  levothyroxine (SYNTHROID, LEVOTHROID) 75 MCG tablet Take 1 tablet by mouth daily. 12/26/14   Historical Provider, MD  Polyethyl Glycol-Propyl Glycol (SYSTANE OP) Apply 1-2 drops to eye 2 (two) times daily.    Historical Provider, MD  sulfamethoxazole-trimethoprim (BACTRIM DS,SEPTRA DS) 800-160 MG tablet Take 1 tablet by mouth 2 (two) times daily. 08/29/16 09/05/16  Elson AreasLeslie K Sofia, PA-C    Family History Family History  Problem Relation Age of Onset  . Diabetes Father   . Heart disease Father 6650    MI  . Heart disease Maternal Grandmother     Social History Social History  Substance Use Topics  . Smoking status: Current Every Day Smoker    Packs/day: 1.00    Types: Cigarettes  . Smokeless tobacco: Never Used  . Alcohol use No     Comment: social     Allergies   Patient has no known allergies.  Review of Systems Review of Systems  Constitutional: Negative for fever.  Gastrointestinal: Negative for anorexia and nausea.  All other systems reviewed and are negative.    Physical Exam Updated Vital Signs BP 163/95   Pulse 100   Temp 98.7 F (37.1 C) (Oral)   Resp 18   Ht 5\' 3"  (1.6 m)   Wt 58.5 kg   LMP 08/28/2016   SpO2 100%   BMI 22.85 kg/m   Physical Exam  Musculoskeletal: Normal range of motion.  Neurological: She is alert.  Skin: There is erythema.  1cm open oozing area, no drainage.    Psychiatric: She has a normal mood and affect.  Nursing note and vitals reviewed.    ED Treatments / Results  Labs (all labs ordered are listed, but only abnormal results are displayed) Labs Reviewed - No data to display  EKG  EKG  Interpretation None       Radiology No results found.  Procedures Procedures (including critical care time)  Medications Ordered in ED Medications - No data to display   Initial Impression / Assessment and Plan / ED Course  I have reviewed the triage vital signs and the nursing notes.  Pertinent labs & imaging results that were available during my care of the patient were reviewed by me and considered in my medical decision making (see chart for details).  Clinical Course     No outpatient prescriptions have been marked as taking for the 08/29/16 encounter [facility](Hospital Encounter).    Final Clinical Impressions(s) / ED Diagnoses   Final diagnoses:  Abscess    New Prescriptions New Prescriptions   SULFAMETHOXAZOLE-TRIMETHOPRIM (BACTRIM DS,SEPTRA DS) 800-160 MG TABLET    Take 1 tablet by mouth 2 (two) times daily.  An After Visit Summary was printed and given to the patient.   Lonia SkinnerLeslie K HighlandvilleSofia, PA-C 08/29/16 2134    Pricilla LovelessScott Goldston, MD 09/05/16 1600

## 2016-08-29 NOTE — ED Triage Notes (Signed)
Pt c/o ? Abscess to right inner thigh that started earlier in the week,

## 2016-08-29 NOTE — Discharge Instructions (Signed)
See your Physicain for recheck in 3-4 days if not resolving

## 2016-09-04 ENCOUNTER — Encounter: Payer: Self-pay | Admitting: Cardiology

## 2016-09-16 ENCOUNTER — Encounter: Payer: Self-pay | Admitting: Cardiology

## 2016-09-16 NOTE — Progress Notes (Signed)
Cardiology Office Note  Date: 09/17/2016   ID: Kristin CarbonKeesha M Robb, DOB 06/03/1976, MRN [id number]  PCP: Cassell SmilesFUSCO,LAWRENCE J., MD  Consulting Cardiologist: Nona DellSamuel Ieasha Boerema, MD   Chief Complaint  Patient presents with  . Palpitations    History of Present Illness: Kristin [name] is a 40 y.o. female referred for cardiology consultation by Dr. Sherwood GamblerFusco. She is here today with her mother and sister. She presents describing a feeling of rapid heart rate, sometimes occurs when she stands up, other times when she is anxious. She feels jittery and shaky, having trouble dealing with things, getting more upset than normal. She does have a history of thyroid disease with previous thyrotoxicosis although this has been stable and her recent TSH was normal. She does not have any history of cardiomyopathy or arrhythmia. She was prescribed Inderal to take as needed by Dr. Sherwood GamblerFusco.  Record review finds prior evaluation by Dr. Mayford Knifeurner in our practice back in March 2016 for assessment of palpitations. She had an echocardiogram done at that time which was reassuring as outlined below. Event monitor was also obtained demonstrating sinus rhythm with lead artifact and no specific arrhythmias.  I reviewed her ECG today which shows sinus rhythm with R' lead V1 and V2.  Past Medical History:  Diagnosis Date  . Anemia   . Anxiety   . Essential hypertension   . GERD (gastroesophageal reflux disease)   . Graves disease   . History of UTI   . Hyperthyroidism   . Thyrotoxicosis     Past Surgical History:  Procedure Laterality Date  . ECTOPIC PREGNANCY SURGERY    . TUBAL LIGATION  10/08/2011   Procedure: POST PARTUM TUBAL LIGATION;  Surgeon: Catalina AntiguaPeggy Constant, MD;  Location: [facility];  Service: Gynecology;  Laterality: Bilateral;  . WISDOM TOOTH EXTRACTION      Current Outpatient Prescriptions  Medication Sig Dispense Refill  . ALPRAZolam (XANAX) 0.25 MG tablet Take 0.25 mg by mouth at bedtime as needed for anxiety.    Marland Kitchen.  ibuprofen (ADVIL,MOTRIN) 200 MG tablet Take 200 mg by mouth every 8 (eight) hours as needed (PAIN).     Marland Kitchen. levothyroxine (SYNTHROID, LEVOTHROID) 112 MCG tablet Take 112 mcg by mouth daily before breakfast.    . Polyethyl Glycol-Propyl Glycol (SYSTANE OP) Apply 1-2 drops to eye 2 (two) times daily.    . propranolol (INDERAL) 10 MG tablet Take 10 mg by mouth 4 (four) times daily as needed.     No current facility-administered medications for this visit.    Allergies:  Sulfa antibiotics   Social History: The patient  reports that she has been smoking Cigarettes.  She has been smoking about 1.00 pack per day. She has never used smokeless tobacco. She reports that she does not drink alcohol or use drugs.   Family History: The patient's family history includes Diabetes in her father; Heart disease in her maternal grandmother; Heart disease (age of onset: 6450) in her father.   ROS:  Please see the history of present illness. Otherwise, complete review of systems is positive for anxiety.  All other systems are reviewed and negative.   Physical Exam: VS:  BP 122/80   Pulse 93   Ht 5\' 3"  (1.6 m)   Wt 128 lb (58.1 kg)   LMP 08/28/2016   SpO2 99%   BMI 22.67 kg/m , BMI Body mass index is 22.67 kg/m.  Wt Readings from Last 3 Encounters:  09/17/16 128 lb (58.1 kg)  08/29/16 129 lb (  58.5 kg)  02/22/16 130 lb (59 kg)    General: Normally nourished appearing woman, comfortable at rest. HEENT: Conjunctiva normal, mild exophthalmus, oropharynx clear. Neck: Supple, no elevated JVP or carotid bruits, no thyromegaly. Lungs: Clear to auscultation, nonlabored breathing at rest. Cardiac: Regular rate and rhythm, no S3 or significant systolic murmur, no pericardial rub. Abdomen: Soft, nontender, bowel sounds present, no guarding or rebound. Extremities: No pitting edema, distal pulses 2+. Skin: Warm and dry. Musculoskeletal: No kyphosis. Neuropsychiatric: Alert and oriented x3, affect grossly  appropriate.  ECG: I personally reviewed the tracing from 02/21/2016 which showed sinus rhythm with R' in lead V1 and V2.  Recent Labwork: 02/21/2016: BUN 11; Creatinine, Ser 0.63; Hemoglobin 13.8; Magnesium 2.1; Platelets 155; Potassium 3.7; Sodium 137; TSH 2.122     Component Value Date/Time   CHOL 150 12/13/2013 0956   TRIG 68 12/13/2013 0956   HDL 43 12/13/2013 0956   CHOLHDL 3.5 12/13/2013 0956   VLDL 14 12/13/2013 0956   LDLCALC 93 12/13/2013 0956  November 2017: TSH 1.9  Other Studies Reviewed Today:  Echocardiogram 01/11/2015: Study Conclusions  - Left ventricle: The cavity size was normal. Systolic function was normal. The estimated ejection fraction was in the range of 60% to 65%. Wall motion was normal; there were no regional wall motion abnormalities. Left ventricular diastolic function parameters were normal. - Aortic valve: There was no regurgitation. - Aortic root: The aortic root was normal in size. - Left atrium: The atrium was normal in size. - Right ventricle: Systolic function was normal. - Right atrium: The atrium was normal in size. - Tricuspid valve: There was trivial regurgitation. - Pulmonic valve: There was no regurgitation. - Pulmonary arteries: Systolic pressure was within the normal range. - Inferior vena cava: The vessel was normal in size. - Pericardium, extracardiac: There was no pericardial effusion.  Impressions:  - Normal study.  Chest x-ray 02/22/2016: FINDINGS: The heart size and mediastinal contours are within normal limits. Both lungs are clear. The visualized skeletal structures are unremarkable.  IMPRESSION: No active cardiopulmonary disease.  Assessment and Plan:  Patient presents for evaluation of elevated heart rate and palpitations. This is intermittently noted, associated with anxiety as well as. Possibly some orthostatic component although she has had no syncope. Recent TSH normal with known history of thyroid  disease on Synthroid. She has not used Inderal. ECG also normal. She had a workup last year that revealed structurally normal heart. We have discussed options today, I would like to have her wear a 48-hour Holter monitor mainly to make sure she is not having any paroxysmal arrhythmias. If that is not the case, she is likely having secondary sinus tachycardia which would not specifically need medical therapy other than searching for other underlying causes with her PCP. There may be a significant component of anxiety or possibly depression based on discussion with her today. I have also recommended that she maintain an adequate hydration state and consider an exercise plan.  Current medicines were reviewed with the patient today.   Orders Placed This Encounter  Procedures  . EKG 12-Lead    Disposition: Call with results.  Signed, Jonelle SidleSamuel G. Dabria Wadas, MD, [facility] Hospital[facility]ACC 09/17/2016 10:38 AM    [facility] at [facility] [address], [place], [place] [address] Phone: [phone]) [phone]; Fax: [phone]

## 2016-09-17 ENCOUNTER — Ambulatory Visit (INDEPENDENT_AMBULATORY_CARE_PROVIDER_SITE_OTHER): Payer: BC Managed Care – PPO | Admitting: Cardiology

## 2016-09-17 ENCOUNTER — Encounter: Payer: Self-pay | Admitting: Cardiology

## 2016-09-17 ENCOUNTER — Ambulatory Visit (HOSPITAL_COMMUNITY)
Admission: RE | Admit: 2016-09-17 | Discharge: 2016-09-17 | Disposition: A | Discharge status: Home / Self Care | Payer: BC Managed Care – PPO | Source: Ambulatory Visit | Attending: Cardiology | Admitting: Cardiology

## 2016-09-17 VITALS — BP 122/80 | HR 93 | Ht 63.0 in | Wt 128.0 lb

## 2016-09-17 DIAGNOSIS — R002 Palpitations: Secondary | ICD-10-CM

## 2016-09-17 DIAGNOSIS — Z8639 Personal history of other endocrine, nutritional and metabolic disease: Secondary | ICD-10-CM

## 2016-09-17 DIAGNOSIS — R000 Tachycardia, unspecified: Secondary | ICD-10-CM | POA: Diagnosis not present

## 2016-09-17 DIAGNOSIS — F419 Anxiety disorder, unspecified: Secondary | ICD-10-CM

## 2016-09-17 NOTE — Patient Instructions (Signed)
Medication Instructions:  Your physician recommends that you continue on your current medications as directed. Please refer to the Current Medication list given to you today.   Labwork: NONE  Testing/Procedures: Your physician has recommended that you wear a holter monitor. Holter monitors are medical devices that record the heart's electrical activity. Doctors most often use these monitors to diagnose arrhythmias. Arrhythmias are problems with the speed or rhythm of the heartbeat. The monitor is a small, portable device. You can wear one while you do your normal daily activities. This is usually used to diagnose what is causing palpitations/syncope (passing out).( 48 HOUR )     Follow-Up: Your physician recommends that you schedule a follow-up appointment in: We will call with results and follow up as needed    Any Other Special Instructions Will Be Listed Below (If Applicable).     If you need a refill on your cardiac medications before your next appointment, please call your pharmacy.

## 2016-09-17 NOTE — Addendum Note (Signed)
Addended by: Abelino DerrickMCGHEE, LISA R on: 09/17/2016 12:30 PM   Modules accepted: Orders

## 2017-06-15 ENCOUNTER — Emergency Department (HOSPITAL_COMMUNITY)
Admission: EM | Admit: 2017-06-15 | Discharge: 2017-06-16 | Disposition: A | Discharge status: Home / Self Care | Payer: BLUE CROSS/BLUE SHIELD | Attending: Emergency Medicine | Admitting: Emergency Medicine

## 2017-06-15 ENCOUNTER — Encounter (HOSPITAL_COMMUNITY): Payer: Self-pay | Admitting: Emergency Medicine

## 2017-06-15 DIAGNOSIS — J0100 Acute maxillary sinusitis, unspecified: Secondary | ICD-10-CM | POA: Diagnosis not present

## 2017-06-15 DIAGNOSIS — F17210 Nicotine dependence, cigarettes, uncomplicated: Secondary | ICD-10-CM | POA: Insufficient documentation

## 2017-06-15 DIAGNOSIS — Z79899 Other long term (current) drug therapy: Secondary | ICD-10-CM | POA: Diagnosis not present

## 2017-06-15 DIAGNOSIS — E039 Hypothyroidism, unspecified: Secondary | ICD-10-CM | POA: Insufficient documentation

## 2017-06-15 DIAGNOSIS — G501 Atypical facial pain: Secondary | ICD-10-CM | POA: Diagnosis present

## 2017-06-15 DIAGNOSIS — I10 Essential (primary) hypertension: Secondary | ICD-10-CM | POA: Diagnosis not present

## 2017-06-15 MED ORDER — DEXAMETHASONE 4 MG PO TABS: 4.0000 mg | ORAL_TABLET | Freq: Two times a day (BID) | ORAL | 0 refills | Status: DC

## 2017-06-15 MED ORDER — CLINDAMYCIN HCL 150 MG PO CAPS: 150.0000 mg | ORAL_CAPSULE | Freq: Four times a day (QID) | ORAL | 0 refills | Status: DC

## 2017-06-15 MED ORDER — OXYMETAZOLINE HCL 0.05 % NA SOLN
2.0000 | Freq: Once | NASAL | Status: AC
  Administered 2017-06-16: 2 via NASAL
  Filled 2017-06-15: qty 15

## 2017-06-15 MED ORDER — CLINDAMYCIN HCL 150 MG PO CAPS
300.0000 mg | ORAL_CAPSULE | Freq: Once | ORAL | Status: AC
  Administered 2017-06-16: 300 mg via ORAL
  Filled 2017-06-15: qty 2

## 2017-06-15 MED ORDER — IBUPROFEN 800 MG PO TABS
800.0000 mg | ORAL_TABLET | Freq: Once | ORAL | Status: DC
  Filled 2017-06-15: qty 1

## 2017-06-15 MED ORDER — TRAMADOL HCL 50 MG PO TABS
100.0000 mg | ORAL_TABLET | Freq: Once | ORAL | Status: DC
  Filled 2017-06-15: qty 2

## 2017-06-15 MED ORDER — DEXAMETHASONE SODIUM PHOSPHATE 10 MG/ML IJ SOLN
10.0000 mg | Freq: Once | INTRAMUSCULAR | Status: AC
  Administered 2017-06-16: 10 mg via INTRAMUSCULAR
  Filled 2017-06-15: qty 1

## 2017-06-15 MED ORDER — IBUPROFEN 600 MG PO TABS: 600.0000 mg | ORAL_TABLET | Freq: Four times a day (QID) | ORAL | 0 refills | Status: DC

## 2017-06-15 MED ORDER — TRAMADOL HCL 50 MG PO TABS: 50.0000 mg | ORAL_TABLET | Freq: Four times a day (QID) | ORAL | 0 refills | Status: DC | PRN

## 2017-06-15 NOTE — ED Provider Notes (Signed)
[facility]Y DEPT Provider Note   CSN: [id number] Arrival date & time: 06/15/17  2122     History   Chief Complaint Chief Complaint  Patient presents with  . Facial Pain    HPI Kristin [name] is a 41 y.o. female.  The history is provided by the patient.    Past Medical History:  Diagnosis Date  . Anemia   . Anxiety   . Essential hypertension   . GERD (gastroesophageal reflux disease)   . Graves disease   . History of UTI   . Hyperthyroidism   . Thyrotoxicosis     Patient Active Problem List   Diagnosis Date Noted  . Tobacco use disorder 12/13/2013  . Family history of early CAD 12/13/2013  . Hypothyroidism (acquired) 11/20/2013  . Anxiety   . GERD (gastroesophageal reflux disease)   . S/P tubal ligation 10/09/2011  . Supervision of normal intrauterine pregnancy in multigravida 10/08/2011  . Anemia 07/03/2011    Past Surgical History:  Procedure Laterality Date  . ECTOPIC PREGNANCY SURGERY    . TUBAL LIGATION  10/08/2011   Procedure: POST PARTUM TUBAL LIGATION;  Surgeon: Catalina Antigua, MD;  Location: [facility];  Service: Gynecology;  Laterality: Bilateral;  . WISDOM TOOTH EXTRACTION      OB History    Gravida Para Term Preterm AB Living   SAB TAB Ectopic Multiple Live Births       2   1       Home Medications    Prior to Admission medications   Medication Sig Start Date End Date Taking? Authorizing Provider  ALPRAZolam Prudy Feeler) 0.25 MG tablet Take 0.25 mg by mouth at bedtime as needed for anxiety.    [provider]  ibuprofen (ADVIL,MOTRIN) 200 MG tablet Take 200 mg by mouth every 8 (eight) hours as needed (PAIN).     [provider]  levothyroxine (SYNTHROID, LEVOTHROID) 112 MCG tablet Take 112 mcg by mouth daily before breakfast.    [provider]  Polyethyl Glycol-Propyl Glycol (SYSTANE OP) Apply 1-2 drops to eye 2 (two) times daily.    [provider]  propranolol (INDERAL) 10 MG tablet Take 10 mg  by mouth 4 (four) times daily as needed.    [provider]    Family History Family History  Problem Relation Age of Onset  . Diabetes Father   . Heart disease Father 20       MI  . Heart disease Maternal Grandmother     Social History Social History  Substance Use Topics  . Smoking status: Current Every Day Smoker    Packs/day: 1.00    Types: Cigarettes  . Smokeless tobacco: Never Used  . Alcohol use No     Comment: social     Allergies   Sulfa antibiotics   Review of Systems Review of Systems  Constitutional: Negative for activity change.       All ROS Neg except as noted in HPI  HENT: Positive for congestion, sinus pain and sinus pressure. Negative for nosebleeds.   Eyes: Negative for photophobia and discharge.  Respiratory: Negative for cough, shortness of breath and wheezing.   Cardiovascular: Negative for chest pain and palpitations.  Gastrointestinal: Negative for abdominal pain and blood in stool.  Genitourinary: Negative for dysuria, frequency and hematuria.  Musculoskeletal: Negative for arthralgias, back pain and neck pain.  Skin: Negative.   Neurological: Negative for dizziness, seizures and speech difficulty.  Psychiatric/Behavioral: Negative for confusion and hallucinations.     Physical Exam Updated Vital Signs BP (!) 171/92 (BP Location: Right Arm)   Pulse 63   Temp 98.6 F (37 C) (Oral)   Resp 17   Ht 5\' 3"  (1.6 m)   Wt 58.5 kg (129 lb)   LMP 06/08/2017   SpO2 99%   BMI 22.85 kg/m   Physical Exam  Constitutional: She is oriented to person, place, and time. She appears well-developed and well-nourished.  Non-toxic appearance.  HENT:  Head: Normocephalic.  Right Ear: Tympanic membrane and external ear normal.  Left Ear: Tympanic membrane and external ear normal.  Nasal congestion present. Some pain to percussion of maxillary sinus on the left.  Eyes: Pupils are equal, round, and reactive to light. EOM and lids are normal.    Neck: Normal range of motion. Neck supple. Carotid bruit is not present.  Cardiovascular: Normal rate, regular rhythm, normal heart sounds, intact distal pulses and normal pulses.   Pulmonary/Chest: Breath sounds normal. No respiratory distress.  Abdominal: Soft. Bowel sounds are normal. There is no tenderness. There is no guarding.  Musculoskeletal: Normal range of motion.  Lymphadenopathy:       Head (right side): No submandibular adenopathy present.       Head (left side): No submandibular adenopathy present.    She has no cervical adenopathy.  Neurological: She is alert and oriented to person, place, and time. She has normal strength. No cranial nerve deficit or sensory deficit.  Skin: Skin is warm and dry.  Psychiatric: She has a normal mood and affect. Her speech is normal.  Nursing note and vitals reviewed.    ED Treatments / Results  Labs (all labs ordered are listed, but only abnormal results are displayed) Labs Reviewed - No data to display  EKG  EKG Interpretation None       Radiology No results found.  Procedures Procedures (including critical care time)  Medications Ordered in ED Medications - No data to display   Initial Impression / Assessment and Plan / ED Course  I have reviewed the triage vital signs and the nursing notes.  Pertinent labs & imaging results that were available during my care of the patient were reviewed by me and considered in my medical decision making (see chart for details).       Final Clinical Impressions(s) / ED Diagnoses MDM Blood pressure elevated at 171/92, otherwise vital signs within normal limits. The examination favors sinusitis. Patient will be treated with intramuscular steroids, anti-inflammatory pain medication, and Ultram for more severe pain. I've asked patient to see her primary physician for follow-up and management. The patient states she has an appointment with her primary physician on tomorrow, September 11.    Final diagnoses:  Acute maxillary sinusitis, recurrence not specified    New Prescriptions New Prescriptions   No medications on file     Duayne CalBryant, Summerlyn Fickel, PA-C 06/15/17 2348    Bethann BerkshireZammit, Joseph, MD 06/16/17 [id number]

## 2017-06-15 NOTE — Discharge Instructions (Signed)
Your blood pressure is elevated at 171/92, please have this rechecked. Your examination suggest sinusitis. Please use clindamycin and Decadron and ibuprofen daily. May use Ultram for more severe pain.This medication may cause drowsiness. Please do not drink, drive, or participate in activity that requires concentration while taking this medication.  Please see Dr.Fusco for follow-up and recheck in the office.

## 2017-06-15 NOTE — ED Triage Notes (Signed)
Pt c/o left side of face hurting x 2 days. Pt states she needs something for her sinuses.

## 2017-06-16 NOTE — ED Notes (Signed)
Pt ambulatory to waiting room. Pt verbalized understanding of discharge instructions.   

## 2017-09-09 HISTORY — PX: EYE SURGERY: SHX253

## 2017-09-15 ENCOUNTER — Emergency Department (HOSPITAL_COMMUNITY)
Admission: EM | Admit: 2017-09-15 | Discharge: 2017-09-16 | Disposition: A | Discharge status: Home / Self Care | Payer: BLUE CROSS/BLUE SHIELD | Attending: Emergency Medicine | Admitting: Emergency Medicine

## 2017-09-15 DIAGNOSIS — M542 Cervicalgia: Secondary | ICD-10-CM | POA: Diagnosis present

## 2017-09-15 DIAGNOSIS — Z79899 Other long term (current) drug therapy: Secondary | ICD-10-CM | POA: Diagnosis not present

## 2017-09-15 DIAGNOSIS — F17210 Nicotine dependence, cigarettes, uncomplicated: Secondary | ICD-10-CM | POA: Insufficient documentation

## 2017-09-15 DIAGNOSIS — I10 Essential (primary) hypertension: Secondary | ICD-10-CM | POA: Diagnosis not present

## 2017-09-15 DIAGNOSIS — E039 Hypothyroidism, unspecified: Secondary | ICD-10-CM | POA: Insufficient documentation

## 2017-09-15 DIAGNOSIS — R091 Pleurisy: Secondary | ICD-10-CM | POA: Insufficient documentation

## 2017-09-15 NOTE — ED Triage Notes (Addendum)
Pt states she had an orbital decompression Dec. 5th. Pt now c/o pain to the left side of her neck, pain in her left shoulder, left rib pain when she takes a deep breath and episode of numbness to her left cheek.

## 2017-09-16 ENCOUNTER — Emergency Department (HOSPITAL_COMMUNITY): Payer: BLUE CROSS/BLUE SHIELD

## 2017-09-16 ENCOUNTER — Encounter (HOSPITAL_COMMUNITY): Payer: Self-pay | Admitting: *Deleted

## 2017-09-16 LAB — TROPONIN I

## 2017-09-16 LAB — COMPREHENSIVE METABOLIC PANEL
ALBUMIN: 4.4 g/dL (ref 3.5–5.0)
ALK PHOS: 82 U/L (ref 38–126)
ALT: 14 U/L (ref 14–54)
ANION GAP: 9 (ref 5–15)
AST: 22 U/L (ref 15–41)
BILIRUBIN TOTAL: 0.5 mg/dL (ref 0.3–1.2)
BUN: 12 mg/dL (ref 6–20)
CALCIUM: 8.6 mg/dL — AB (ref 8.9–10.3)
CHLORIDE: 103 mmol/L (ref 101–111)
CO2: 24 mmol/L (ref 22–32)
CREATININE: 0.91 mg/dL (ref 0.44–1.00)
GFR calc non Af Amer: >60 mL/min (ref >60)
Glucose, Bld: 95 mg/dL (ref 65–99)
Potassium: 3.9 mmol/L (ref 3.5–5.1)
SODIUM: 136 mmol/L (ref 135–145)
Total Protein: 7.7 g/dL (ref 6.5–8.1)

## 2017-09-16 LAB — D-DIMER, QUANTITATIVE (NOT AT ARMC)

## 2017-09-16 LAB — CBC WITH DIFFERENTIAL/PLATELET
Basophils Absolute: 0 10*3/uL (ref 0.0–0.1)
Basophils Relative: 0 %
EOS ABS: 0.2 10*3/uL (ref 0.0–0.7)
Eosinophils Relative: 2 %
HEMATOCRIT: 46.6 % — AB (ref 36.0–46.0)
HEMOGLOBIN: 15.1 g/dL — AB (ref 12.0–15.0)
Lymphocytes Relative: 28 %
Lymphs Abs: 2.7 10*3/uL (ref 0.7–4.0)
MCH: 29.9 pg (ref 26.0–34.0)
MCHC: 32.4 g/dL (ref 30.0–36.0)
MCV: 92.3 fL (ref 78.0–100.0)
MONOS PCT: 10 %
Monocytes Absolute: 1 10*3/uL (ref 0.1–1.0)
NEUTROS ABS: 5.7 10*3/uL (ref 1.7–7.7)
NEUTROS PCT: 60 %
Platelets: 226 10*3/uL (ref 150–400)
RBC: 5.05 MIL/uL (ref 3.87–5.11)
RDW: 13.7 % (ref 11.5–15.5)
WBC: 9.5 10*3/uL (ref 4.0–10.5)

## 2017-09-16 LAB — I-STAT BETA HCG BLOOD, ED (MC, WL, AP ONLY)

## 2017-09-16 NOTE — ED Notes (Signed)
Patient transported to X-ray 

## 2017-09-16 NOTE — ED Provider Notes (Signed)
Patient seen/examined in the Emergency Department in conjunction with Midlevel Provider LeslieSofia Patient reports 2 complaint: neck pain and shoulder pain as well as left-sided chest pain with deep breathing.  reports recent orbitotomy due to Graves' disease Exam : Awake alert no distress patch on right eye, mild tenderness to  left neck but no focal weakness in her extremities no murmurs noted on cardiac exam Plan: Imaging/labs pending at this time   Zadie RhineWickline, Merrel Crabbe, MD 09/16/17 0025

## 2017-09-16 NOTE — ED Provider Notes (Signed)
[facility]PART[facility] Provider Note   CSN: [id number] Arrival date & time: 09/15/17  2324     History   Chief Complaint Chief Complaint  Patient presents with  . Neck Pain    HPI Kristin CarbonKeesha M [name] is a 41 y.o. female.  The history is provided by the patient. No language interpreter was used.  Chest Pain   This is a new problem. The current episode started more than 2 days ago. The problem has been gradually worsening. The pain is associated with breathing. The pain is present in the lateral region. The pain is moderate. The pain radiates to the left neck and left shoulder. Associated symptoms include nausea. Pertinent negatives include no abdominal pain. She has tried nothing for the symptoms. The treatment provided no relief.  Pt [facility]. Pt has a history of graves disease.   Past Medical History:  Diagnosis Date  . Anemia   . Anxiety   . Essential hypertension   . GERD (gastroesophageal reflux disease)   . Graves disease   . History of UTI   . Hyperthyroidism   . Thyrotoxicosis     Patient Active Problem List   Diagnosis Date Noted  . Tobacco use disorder 12/13/2013  . Family history of early CAD 12/13/2013  . Hypothyroidism (acquired) 11/20/2013  . Anxiety   . GERD (gastroesophageal reflux disease)   . S/P tubal ligation 10/09/2011  . Supervision of normal intrauterine pregnancy in multigravida 10/08/2011  . Anemia 07/03/2011    Past Surgical History:  Procedure Laterality Date  . ECTOPIC PREGNANCY SURGERY    . EYE SURGERY  09/09/2017   orbital decompression   . TUBAL LIGATION  10/08/2011   Procedure: POST PARTUM TUBAL LIGATION;  Surgeon: Catalina AntiguaPeggy Constant, MD;  Location: [facility];  Service: Gynecology;  Laterality: Bilateral;  . WISDOM TOOTH EXTRACTION      OB History    Gravida Para Term Preterm AB Living   4 2 2   2 2    SAB TAB Ectopic Multiple Live Births       2   1       Home Medications    Prior to Admission  medications   Medication Sig Start Date End Date Taking? Authorizing Provider  ALPRAZolam Prudy Feeler(XANAX) 0.25 MG tablet Take 0.25 mg by mouth at bedtime as needed for anxiety.    [provider]  clindamycin (CLEOCIN) 150 MG capsule Take 1 capsule (150 mg total) by mouth every 6 (six) hours. 06/15/17   Ivery QualeBryant, Hobson, PA-C  dexamethasone (DECADRON) 4 MG tablet Take 1 tablet (4 mg total) by mouth 2 (two) times daily with a meal. 06/15/17   Ivery QualeBryant, Hobson, PA-C  ibuprofen (ADVIL,MOTRIN) 600 MG tablet Take 1 tablet (600 mg total) by mouth 4 (four) times daily. 06/15/17   Ivery QualeBryant, Hobson, PA-C  levothyroxine (SYNTHROID, LEVOTHROID) 112 MCG tablet Take 112 mcg by mouth daily before breakfast.    [provider]  Polyethyl Glycol-Propyl Glycol (SYSTANE OP) Apply 1-2 drops to eye 2 (two) times daily.    [provider]  propranolol (INDERAL) 10 MG tablet Take 10 mg by mouth 4 (four) times daily as needed.    [provider]  traMADol (ULTRAM) 50 MG tablet Take 1 tablet (50 mg total) by mouth every 6 (six) hours as needed. 06/15/17   Ivery QualeBryant, Hobson, PA-C    Family History Family History  Problem Relation Age of Onset  . Diabetes Father   .  Heart disease Father 5550       MI  . Heart disease Maternal Grandmother     Social History Social History   Tobacco Use  . Smoking status: Current Every Day Smoker    Packs/day: 1.00    Types: Cigarettes  . Smokeless tobacco: Never Used  Substance Use Topics  . Alcohol use: No    Comment: social  . Drug use: No     Allergies   Sulfa antibiotics   Review of Systems Review of Systems  Cardiovascular: Positive for chest pain.  Gastrointestinal: Positive for nausea. Negative for abdominal pain.  All other systems reviewed and are negative.    Physical Exam Updated Vital Signs BP (!) 165/91 (BP Location: Right Arm)   Pulse (!) 102   Temp 98.4 F (36.9 C)   Resp 18   Ht 5\' 3"  (1.6 m)   Wt 59 kg (130 lb)   LMP  09/14/2017   SpO2 99%   BMI 23.03 kg/m   Physical Exam  Constitutional: She appears well-developed and well-nourished. No distress.  HENT:  Head: Normocephalic and atraumatic.  Eyes:  Patch over right eye,  Neck: Neck supple.  Cardiovascular: Normal rate and regular rhythm.  No murmur heard. Pulmonary/Chest: Effort normal and breath sounds normal. No respiratory distress.  Abdominal: Soft. There is no tenderness.  Musculoskeletal: She exhibits no edema.  Neurological: She is alert.  Skin: Skin is warm and dry.  Psychiatric: She has a normal mood and affect.  Nursing note and vitals reviewed.    ED Treatments / Results  Labs (all labs ordered are listed, but only abnormal results are displayed) Labs Reviewed  CBC WITH DIFFERENTIAL/PLATELET  COMPREHENSIVE METABOLIC PANEL  TROPONIN I  D-DIMER, QUANTITATIVE (NOT AT [facility])    EKG  EKG Interpretation None       Radiology No results found.  Procedures Procedures (including critical care time)  Medications Ordered in ED Medications - No data to display   Initial Impression / Assessment and Plan / ED Course  I have reviewed the triage vital signs and the nursing notes.  Pertinent labs & imaging results that were available during my care of the patient were reviewed by me and considered in my medical decision making (see chart for details).     Pt's care turned over to Dr. Bebe ShaggyWickline at 1:00am  Final Clinical Impressions(s) / ED Diagnoses   Final diagnoses:  None    ED Discharge Orders    None       Osie CheeksSofia, Kateleen Encarnacion K, PA-C 09/16/17 0052    Zadie RhineWickline, Donald, MD 09/16/17 [phone]

## 2017-09-16 NOTE — Discharge Instructions (Signed)
You have neck pain, possibly from a cervical strain and/or pinched nerve.   SEEK IMMEDIATE MEDICAL ATTENTION IF: You develop difficulties swallowing or breathing.  You have new or worse numbness, weakness, tingling, or movement problems in your arms or legs.  You develop increasing pain which is uncontrolled with medications.  You have change in bowel or bladder function, or other concerns.   Your caregiver has diagnosed you as having chest pain that is not specific for one problem, but does not require admission.  Chest pain comes from many different causes.  SEEK IMMEDIATE MEDICAL ATTENTION IF: You have severe chest pain, especially if the pain is crushing or pressure-like and spreads to the arms, back, neck, or jaw, or if you have sweating, nausea (feeling sick to your stomach), or shortness of breath. THIS IS AN EMERGENCY. Don't wait to see if the pain will go away. Get medical help at once. Call 911 or 0 (operator). DO NOT drive yourself to the hospital.  Your chest pain gets worse and does not go away with rest.  You have an attack of chest pain lasting longer than usual, despite rest and treatment with the medications your caregiver has prescribed.  You wake from sleep with chest pain or shortness of breath.  You feel dizzy or faint.  You have chest pain not typical of your usual pain for which you originally saw your caregiver.

## 2017-11-19 ENCOUNTER — Ambulatory Visit (INDEPENDENT_AMBULATORY_CARE_PROVIDER_SITE_OTHER): Payer: BLUE CROSS/BLUE SHIELD | Admitting: Otolaryngology

## 2017-11-19 DIAGNOSIS — R490 Dysphonia: Secondary | ICD-10-CM

## 2017-11-19 DIAGNOSIS — F17210 Nicotine dependence, cigarettes, uncomplicated: Secondary | ICD-10-CM | POA: Diagnosis not present

## 2017-11-19 DIAGNOSIS — K219 Gastro-esophageal reflux disease without esophagitis: Secondary | ICD-10-CM | POA: Diagnosis not present

## 2018-02-18 ENCOUNTER — Ambulatory Visit (INDEPENDENT_AMBULATORY_CARE_PROVIDER_SITE_OTHER): Payer: BLUE CROSS/BLUE SHIELD | Admitting: Otolaryngology

## 2018-02-18 DIAGNOSIS — R490 Dysphonia: Secondary | ICD-10-CM | POA: Diagnosis not present

## 2018-02-18 DIAGNOSIS — J382 Nodules of vocal cords: Secondary | ICD-10-CM | POA: Diagnosis not present

## 2018-02-18 DIAGNOSIS — F17210 Nicotine dependence, cigarettes, uncomplicated: Secondary | ICD-10-CM

## 2018-04-15 ENCOUNTER — Encounter: Payer: BC Managed Care – PPO | Admitting: Adult Health

## 2018-04-29 ENCOUNTER — Encounter: Payer: BC Managed Care – PPO | Admitting: Obstetrics and Gynecology

## 2018-05-10 ENCOUNTER — Encounter (INDEPENDENT_AMBULATORY_CARE_PROVIDER_SITE_OTHER): Payer: Self-pay

## 2018-05-10 ENCOUNTER — Ambulatory Visit (INDEPENDENT_AMBULATORY_CARE_PROVIDER_SITE_OTHER): Payer: BC Managed Care – PPO | Admitting: Obstetrics and Gynecology

## 2018-05-10 ENCOUNTER — Encounter: Payer: Self-pay | Admitting: Obstetrics and Gynecology

## 2018-05-10 DIAGNOSIS — N920 Excessive and frequent menstruation with regular cycle: Secondary | ICD-10-CM

## 2018-05-10 DIAGNOSIS — Z113 Encounter for screening for infections with a predominantly sexual mode of transmission: Secondary | ICD-10-CM

## 2018-05-10 NOTE — Progress Notes (Signed)
Patient ID: Kristin [name], female   DOB: 08/20/1976, 42 y.o.   MRN: [id number]   [facility] Visit  @DATE @            Patient name: Kristin [name] MRN [id number]  Date of birth: 11/19/1975  CC & HPI:  Kristin [name] is a 42 y.o. female presenting today for menorrhagia for 2 months. She has very heavy periods with lots of clotting. She says some months are worse that others, she goes through 4-5 pads a day for 7 days while on her period. The first 2 days are normally light but by day 3 and 4 are extremely heavy and tapers off for the rest of the week. (2L 2H 3L). The first day of her LMP was 04/19/18  She has Graves disease and smokes.   ROS:  ROS +menorrhagia -fever -chills All systems are negative except as noted in the HPI and PMH.  Pertinent History Reviewed:   Reviewed: Significant for Tubal ligation Medical         Past Medical History:  Diagnosis Date  . Anemia   . Anxiety   . Essential hypertension   . GERD (gastroesophageal reflux disease)   . Graves disease   . History of UTI   . Hyperthyroidism   . Thyrotoxicosis                               Surgical Hx:    Past Surgical History:  Procedure Laterality Date  . ECTOPIC PREGNANCY SURGERY    . EYE SURGERY  09/09/2017   orbital decompression   . TUBAL LIGATION  10/08/2011   Procedure: POST PARTUM TUBAL LIGATION;  Surgeon: Catalina AntiguaPeggy Constant, MD;  Location: [facility];  Service: Gynecology;  Laterality: Bilateral;  . WISDOM TOOTH EXTRACTION     Medications: Reviewed & Updated - see associated section                       Current Outpatient Medications:  .  ALPRAZolam (XANAX) 0.25 MG tablet, Take 0.25 mg by mouth at bedtime as needed for anxiety., Disp: , Rfl:  .  levothyroxine (SYNTHROID, LEVOTHROID) 112 MCG tablet, Take 112 mcg by mouth daily before breakfast., Disp: , Rfl:  .  Polyethyl Glycol-Propyl Glycol (SYSTANE OP), Apply 1-2 drops to eye 2 (two) times daily., Disp: , Rfl:    Social History: Reviewed -  reports  that she has been smoking cigarettes.  She has a 20.00 pack-year smoking history. She has never used smokeless tobacco.  Objective Findings:  Vitals: Blood pressure (!) 141/85, pulse 99, height 5\' 4"  (1.626 m), weight 136 lb (61.7 kg), last menstrual period 04/19/2018.  PHYSICAL EXAMINATION General appearance - alert, well appearing, and in no distress, oriented to person, place, and time and normal appearing weight Mental status - alert, oriented to person, place, and time, normal mood, behavior, speech, dress, motor activity, and thought processes, affect appropriate to mood  PELVIC External genitalia - 1 small folliculitis  Vagina - vaginal secretions are normal Cervix - multiporous, nontender Uterus - anterior, normal sized Adnexa- Negative GCCHL was obtained  Assessment & Plan:   A:  1.  Menorrhagia  P:  1. Rx Progesterone only Pill (POP)as preferred option , wished to avoid IUD Mirena if possible. 2. F/u 3 months for Gyn  3. Consideration of IUD insert if POP's unseccessful    By signing my  name below, I, Arnette Norris, attest that this documentation has been prepared under the direction and in the presence of Tilda Burrow, MD Electronically Signed: Arnette Norris Medical Scribe. 05/10/18. 12:07 PM.  I personally performed the services described in this documentation, which was SCRIBED in my presence. The recorded information has been reviewed and considered accurate. It has been edited as necessary during review. Tilda Burrow, MD

## 2018-05-12 LAB — GC/CHLAMYDIA PROBE AMP
Chlamydia trachomatis, NAA: NEGATIVE
NEISSERIA GONORRHOEAE BY PCR: NEGATIVE

## 2018-07-23 ENCOUNTER — Other Ambulatory Visit: Payer: Self-pay

## 2018-07-23 ENCOUNTER — Emergency Department (HOSPITAL_COMMUNITY): Payer: Medicaid Other

## 2018-07-23 ENCOUNTER — Emergency Department (HOSPITAL_COMMUNITY)
Admission: EM | Admit: 2018-07-23 | Discharge: 2018-07-23 | Disposition: A | Discharge status: Home / Self Care | Payer: Medicaid Other | Attending: Emergency Medicine | Admitting: Emergency Medicine

## 2018-07-23 ENCOUNTER — Encounter (HOSPITAL_COMMUNITY): Payer: Self-pay

## 2018-07-23 DIAGNOSIS — Z87891 Personal history of nicotine dependence: Secondary | ICD-10-CM | POA: Diagnosis not present

## 2018-07-23 DIAGNOSIS — E039 Hypothyroidism, unspecified: Secondary | ICD-10-CM | POA: Insufficient documentation

## 2018-07-23 DIAGNOSIS — I10 Essential (primary) hypertension: Secondary | ICD-10-CM | POA: Insufficient documentation

## 2018-07-23 DIAGNOSIS — Z79899 Other long term (current) drug therapy: Secondary | ICD-10-CM | POA: Diagnosis not present

## 2018-07-23 DIAGNOSIS — R0789 Other chest pain: Secondary | ICD-10-CM | POA: Diagnosis not present

## 2018-07-23 LAB — BASIC METABOLIC PANEL
ANION GAP: 6 (ref 5–15)
BUN: 16 mg/dL (ref 6–20)
CHLORIDE: 108 mmol/L (ref 98–111)
CO2: 26 mmol/L (ref 22–32)
CREATININE: 0.69 mg/dL (ref 0.44–1.00)
Calcium: 9 mg/dL (ref 8.9–10.3)
Glucose, Bld: 98 mg/dL (ref 70–99)
Potassium: 4.1 mmol/L (ref 3.5–5.1)
SODIUM: 140 mmol/L (ref 135–145)

## 2018-07-23 LAB — I-STAT TROPONIN, ED
TROPONIN I, POC: 0 ng/mL (ref 0.00–0.08)
Troponin i, poc: 0.01 ng/mL (ref 0.00–0.08)

## 2018-07-23 LAB — CBC
HEMATOCRIT: 39.6 % (ref 36.0–46.0)
Hemoglobin: 12.7 g/dL (ref 12.0–15.0)
MCH: 28.6 pg (ref 26.0–34.0)
MCHC: 32.1 g/dL (ref 30.0–36.0)
MCV: 89.2 fL (ref 80.0–100.0)
NRBC: 0 % (ref 0.0–0.2)
PLATELETS: 165 10*3/uL (ref 150–400)
RBC: 4.44 MIL/uL (ref 3.87–5.11)
RDW: 13.5 % (ref 11.5–15.5)
WBC: 6.2 10*3/uL (ref 4.0–10.5)

## 2018-07-23 LAB — TROPONIN I

## 2018-07-23 NOTE — ED Triage Notes (Signed)
Pt reports chest pain intermittently since Sunday. Pt reports the pain is in the center of the chest and goes into throat. Pt reports taking tums. Pain goes away on on its on

## 2018-07-23 NOTE — ED Provider Notes (Signed)
[facility] EMERGENCY DEPARTMENT Provider Note   CSN: [id number] Arrival date & time: 07/23/18  8657     History   Chief Complaint Chief Complaint  Patient presents with  . Chest Pain    HPI Kristin [name] is a 42 y.o. female.  Complains of anterior chest pain onset 5 days ago.  She reports she had pain initially 5 days ago then again 3 days ago and then did awaken her from sleep this morning.  She has no pain presently.  She is treated herself with Tums with transient relief.  Pain is improved by walking not affected by food anterior, substernal, radiates to throat.  No shortness of breath nausea or sweatiness.  No other associated symptoms.  HPI  Past Medical History:  Diagnosis Date  . Anemia   . Anxiety   . Essential hypertension   . GERD (gastroesophageal reflux disease)   . Graves disease   . History of UTI   . Hyperthyroidism   . Thyrotoxicosis     Patient Active Problem List   Diagnosis Date Noted  . Tobacco use disorder 12/13/2013  . Family history of early CAD 12/13/2013  . Hypothyroidism (acquired) 11/20/2013  . Anxiety   . GERD (gastroesophageal reflux disease)   . S/P tubal ligation 10/09/2011  . Supervision of normal intrauterine pregnancy in multigravida 10/08/2011  . Anemia 07/03/2011    Past Surgical History:  Procedure Laterality Date  . ECTOPIC PREGNANCY SURGERY    . EYE SURGERY  09/09/2017   orbital decompression   . TUBAL LIGATION  10/08/2011   Procedure: POST PARTUM TUBAL LIGATION;  Surgeon: Catalina Antigua, MD;  Location: [facility];  Service: Gynecology;  Laterality: Bilateral;  . WISDOM TOOTH EXTRACTION       OB History    Gravida  4   Para  2   Term  2   Preterm      AB  2   Living  2     SAB      TAB      Ectopic  2   Multiple      Live Births  1            Home Medications    Prior to Admission medications   Medication Sig Start Date End Date Taking? Authorizing Provider  ALPRAZolam Prudy Feeler) 0.25 MG tablet  Take 0.25 mg by mouth at bedtime as needed for anxiety.    [provider]  levothyroxine (SYNTHROID, LEVOTHROID) 112 MCG tablet Take 112 mcg by mouth daily before breakfast.    [provider]  Polyethyl Glycol-Propyl Glycol (SYSTANE OP) Apply 1-2 drops to eye 2 (two) times daily.    [provider]    Family History Family History  Problem Relation Age of Onset  . Diabetes Father   . Heart disease Father 72       MI  . High Cholesterol Mother   . Hypertension Mother   . Liver disease Sister   . Heart disease Maternal Grandmother   . Lupus Paternal Grandmother   Father diagnosed with coronary disease a 32  Social History Social History   Tobacco Use  . Smoking status: Former Smoker    Packs/day: 1.00    Years: 20.00    Pack years: 20.00    Types: Cigarettes    Last attempt to quit: 04/22/2018    Years since quitting: 0.2  . Smokeless tobacco: Never Used  Substance Use Topics  . Alcohol use: Yes  Comment: occ  . Drug use: No   Quit smoking 3 months ago  Allergies   Sulfa antibiotics   Review of Systems Review of Systems  Constitutional: Negative.   HENT: Negative.   Respiratory: Negative.   Cardiovascular: Positive for chest pain.  Gastrointestinal: Negative.   Musculoskeletal: Negative.   Skin: Negative.   Neurological: Negative.   Psychiatric/Behavioral: Negative.   All other systems reviewed and are negative.    Physical Exam Updated Vital Signs BP 140/85 (BP Location: Right Arm)   Pulse 80   Temp 98.1 F (36.7 C) (Oral)   Resp 10   Wt 62.6 kg   LMP 07/05/2018   SpO2 96%   BMI 23.69 kg/m   Physical Exam  Constitutional: She appears well-developed and well-nourished.  HENT:  Head: Normocephalic and atraumatic.  Eyes: Pupils are equal, round, and reactive to light. Conjunctivae are normal.  Neck: Neck supple. No tracheal deviation present. No thyromegaly present.  Cardiovascular: Normal rate and regular rhythm.    No murmur heard. Pulmonary/Chest: Effort normal and breath sounds normal.  Abdominal: Soft. Bowel sounds are normal. She exhibits no distension. There is no tenderness.  Musculoskeletal: Normal range of motion. She exhibits no edema or tenderness.  Neurological: She is alert. Coordination normal.  Skin: Skin is warm and dry. No rash noted.  Psychiatric: She has a normal mood and affect.  Nursing note and vitals reviewed.    ED Treatments / Results  Labs (all labs ordered are listed, but only abnormal results are displayed) Labs Reviewed  BASIC METABOLIC PANEL  CBC  TROPONIN I  I-STAT TROPONIN, ED    EKG None ED ECG REPORT   Date: 07/23/2018  Rate: 70  Rhythm: normal sinus rhythm  QRS Axis: normal  Intervals: normal  ST/T Wave abnormalities: nonspecific T wave changes  Conduction Disutrbances:nonspecific intraventricular conduction delay  Narrative Interpretation:   Old EKG Reviewed: unchanged Unchanged from 09/16/2017 I have personally reviewed the EKG tracing and agree with the computerized printout as noted. Radiology No results found.  Procedures Procedures (including critical care time)  Medications Ordered in ED Medications - No data to display chest x-ray viewed by me Results for orders placed or performed during the hospital encounter of 07/23/18  Basic metabolic panel  Result Value Ref Range   Sodium 140 135 - 145 mmol/L   Potassium 4.1 3.5 - 5.1 mmol/L   Chloride 108 98 - 111 mmol/L   CO2 26 22 - 32 mmol/L   Glucose, Bld 98 70 - 99 mg/dL   BUN 16 6 - 20 mg/dL   Creatinine, Ser 6.04 0.44 - 1.00 mg/dL   Calcium 9.0 8.9 - 54.0 mg/dL   GFR calc non Af Amer >60 >60 mL/min   GFR calc Af Amer >60 >60 mL/min   Anion gap 6 5 - 15  CBC  Result Value Ref Range   WBC 6.2 4.0 - 10.5 K/uL   RBC 4.44 3.87 - 5.11 MIL/uL   Hemoglobin 12.7 12.0 - 15.0 g/dL   HCT 98.1 19.1 - 47.8 %   MCV 89.2 80.0 - 100.0 fL   MCH 28.6 26.0 - 34.0 pg   MCHC 32.1 30.0 - 36.0  g/dL   RDW 29.5 62.1 - 30.8 %   Platelets 165 150 - 400 K/uL   nRBC 0.0 0.0 - 0.2 %  Troponin I  Result Value Ref Range   Troponin I <0.03 <0.03 ng/mL  I-stat troponin, ED  Result Value Ref Range  Troponin i, poc 0.00 0.00 - 0.08 ng/mL   Comment 3          I-stat troponin, ED  Result Value Ref Range   Troponin i, poc 0.01 0.00 - 0.08 ng/mL   Comment 3           Dg Chest 2 View  Result Date: 07/23/2018 CLINICAL DATA:  Chest pain EXAM: CHEST - 2 VIEW COMPARISON:  09/16/2017 FINDINGS: Heart and mediastinal contours are within normal limits. No focal opacities or effusions. No acute bony abnormality. IMPRESSION: No active cardiopulmonary disease. Electronically Signed   By: Charlett Nose M.D.   On: 07/23/2018 08:51   Initial Impression / Assessment and Plan / ED Course  I have reviewed the triage vital signs and the nursing notes.  Pertinent labs & imaging results that were available during my care of the patient were reviewed by me and considered in my medical decision making (see chart for details).     11:30 AM resting comfortably.  Asymptomatic. Pain highly atypical for acute coronary syndrome.  Symptoms more consistent with GERD given that it radiates to her throat.  Pain nonexertional.  Heart score equals 3  Plan continue present medications.  Follow-up with PMD.  May need referral to gastroenterologist. Lab work EKG, chest x-ray reassuring.  No evidence of ACS Final Clinical Impressions(s) / ED Diagnoses  Dx is atypical chest pain Final diagnoses:  None    ED Discharge Orders    None       Doug Sou, MD 07/23/18 1137

## 2018-07-23 NOTE — Discharge Instructions (Addendum)
Continue present medications.  Sleep with the head of your bed elevated.  If you continue to have discomfort, contact Dr. Sherwood Gambler. You may need referral to a specialist such as a gastroenterologist

## 2018-07-23 NOTE — ED Notes (Signed)
E-signature pad not working, unable to obtain pt signature for d/c instructions.  Pt verbalized understanding of d/c instructions.

## 2018-08-11 ENCOUNTER — Ambulatory Visit: Payer: BC Managed Care – PPO | Admitting: Obstetrics and Gynecology

## 2018-08-12 ENCOUNTER — Ambulatory Visit (INDEPENDENT_AMBULATORY_CARE_PROVIDER_SITE_OTHER): Payer: Medicaid Other | Admitting: Otolaryngology

## 2018-08-12 DIAGNOSIS — K219 Gastro-esophageal reflux disease without esophagitis: Secondary | ICD-10-CM

## 2018-08-12 DIAGNOSIS — R490 Dysphonia: Secondary | ICD-10-CM | POA: Diagnosis not present

## 2019-07-11 ENCOUNTER — Encounter: Payer: Self-pay | Admitting: Obstetrics and Gynecology

## 2019-07-11 ENCOUNTER — Other Ambulatory Visit: Payer: Self-pay

## 2019-07-11 ENCOUNTER — Other Ambulatory Visit (HOSPITAL_COMMUNITY)
Admission: RE | Admit: 2019-07-11 | Discharge: 2019-07-11 | Disposition: A | Discharge status: Home / Self Care | Payer: Medicaid Other | Source: Ambulatory Visit | Attending: Obstetrics and Gynecology | Admitting: Obstetrics and Gynecology

## 2019-07-11 ENCOUNTER — Ambulatory Visit (INDEPENDENT_AMBULATORY_CARE_PROVIDER_SITE_OTHER): Payer: Medicaid Other | Admitting: Obstetrics and Gynecology

## 2019-07-11 VITALS — BP 124/83 | HR 89 | Ht 62.0 in | Wt 136.6 lb

## 2019-07-11 DIAGNOSIS — Z01419 Encounter for gynecological examination (general) (routine) without abnormal findings: Secondary | ICD-10-CM | POA: Insufficient documentation

## 2019-07-11 DIAGNOSIS — Z0000 Encounter for general adult medical examination without abnormal findings: Secondary | ICD-10-CM | POA: Diagnosis not present

## 2019-07-11 NOTE — Patient Instructions (Signed)
Health Maintenance, Female Adopting a healthy lifestyle and getting preventive care are important in promoting health and wellness. Ask your health care provider about:  The right schedule for you to have regular tests and exams.  Things you can do on your own to prevent diseases and keep yourself healthy. What should I know about diet, weight, and exercise? Eat a healthy diet   Eat a diet that includes plenty of vegetables, fruits, low-fat dairy products, and lean protein.  Do not eat a lot of foods that are high in solid fats, added sugars, or sodium. Maintain a healthy weight Body mass index (BMI) is used to identify weight problems. It estimates body fat based on height and weight. Your health care provider can help determine your BMI and help you achieve or maintain a healthy weight. Get regular exercise Get regular exercise. This is one of the most important things you can do for your health. Most adults should:  Exercise for at least 150 minutes each week. The exercise should increase your heart rate and make you sweat (moderate-intensity exercise).  Do strengthening exercises at least twice a week. This is in addition to the moderate-intensity exercise.  Spend less time sitting. Even light physical activity can be beneficial. Watch cholesterol and blood lipids Have your blood tested for lipids and cholesterol at 44 years of age, then have this test every 5 years. Have your cholesterol levels checked more often if:  Your lipid or cholesterol levels are high.  You are older than 44 years of age.  You are at high risk for heart disease. What should I know about cancer screening? Depending on your health history and family history, you may need to have cancer screening at various ages. This may include screening for:  Breast cancer.  Cervical cancer.  Colorectal cancer.  Skin cancer.  Lung cancer. What should I know about heart disease, diabetes, and high blood  pressure? Blood pressure and heart disease  High blood pressure causes heart disease and increases the risk of stroke. This is more likely to develop in people who have high blood pressure readings, are of African descent, or are overweight.  Have your blood pressure checked: ? Every 3-5 years if you are 18-39 years of age. ? Every year if you are 40 years old or older. Diabetes Have regular diabetes screenings. This checks your fasting blood sugar level. Have the screening done:  Once every three years after age 40 if you are at a normal weight and have a low risk for diabetes.  More often and at a younger age if you are overweight or have a high risk for diabetes. What should I know about preventing infection? Hepatitis B If you have a higher risk for hepatitis B, you should be screened for this virus. Talk with your health care provider to find out if you are at risk for hepatitis B infection. Hepatitis C Testing is recommended for:  Everyone born from 1945 through 1965.  Anyone with known risk factors for hepatitis C. Sexually transmitted infections (STIs)  Get screened for STIs, including gonorrhea and chlamydia, if: ? You are sexually active and are younger than 44 years of age. ? You are older than 44 years of age and your health care provider tells you that you are at risk for this type of infection. ? Your sexual activity has changed since you were last screened, and you are at increased risk for chlamydia or gonorrhea. Ask your health care provider if   you are at risk.  Ask your health care provider about whether you are at high risk for HIV. Your health care provider may recommend a prescription medicine to help prevent HIV infection. If you choose to take medicine to prevent HIV, you should first get tested for HIV. You should then be tested every 3 months for as long as you are taking the medicine. Pregnancy  If you are about to stop having your period (premenopausal) and  you may become pregnant, seek counseling before you get pregnant.  Take 400 to 800 micrograms (mcg) of folic acid every day if you become pregnant.  Ask for birth control (contraception) if you want to prevent pregnancy. Osteoporosis and menopause Osteoporosis is a disease in which the bones lose minerals and strength with aging. This can result in bone fractures. If you are 65 years old or older, or if you are at risk for osteoporosis and fractures, ask your health care provider if you should:  Be screened for bone loss.  Take a calcium or vitamin D supplement to lower your risk of fractures.  Be given hormone replacement therapy (HRT) to treat symptoms of menopause. Follow these instructions at home: Lifestyle  Do not use any products that contain nicotine or tobacco, such as cigarettes, e-cigarettes, and chewing tobacco. If you need help quitting, ask your health care provider.  Do not use street drugs.  Do not share needles.  Ask your health care provider for help if you need support or information about quitting drugs. Alcohol use  Do not drink alcohol if: ? Your health care provider tells you not to drink. ? You are pregnant, may be pregnant, or are planning to become pregnant.  If you drink alcohol: ? Limit how much you use to 0-1 drink a day. ? Limit intake if you are breastfeeding.  Be aware of how much alcohol is in your drink. In the [place] one drink equals one 12 oz bottle of beer (355 mL), one 5 oz glass of wine (148 mL), or one 1 oz glass of hard liquor (44 mL). General instructions  Schedule regular health, dental, and eye exams.  Stay current with your vaccines.  Tell your health care provider if: ? You often feel depressed. ? You have ever been abused or do not feel safe at home. Summary  Adopting a healthy lifestyle and getting preventive care are important in promoting health and wellness.  Follow your health care provider's instructions about healthy  diet, exercising, and getting tested or screened for diseases.  Follow your health care provider's instructions on monitoring your cholesterol and blood pressure. This information is not intended to replace advice given to you by your health care provider. Make sure you discuss any questions you have with your health care provider. Document Released: 04/07/2011 Document Revised: 09/15/2018 Document Reviewed: 09/15/2018 Elsevier Patient Education  2020 Elsevier Inc.  

## 2019-07-11 NOTE — Progress Notes (Signed)
Kristin [name] is a 43 y.o. [phone] female here for a routine annual gynecologic exam.  Current complaints: perimenopausal Sx including bleeding and menopausal Sx for the last few yrs. Not sexual active. H/O BTL     Gynecologic History Patient's last menstrual period was 06/23/2019. Contraception: tubal ligation Last Pap: 2015. Results were: normal Last mammogram: NA. Results were: NA  Obstetric History OB History  Gravida Para Term Preterm AB Living  4 2 2   2 2   SAB TAB Ectopic Multiple Live Births      2   2    # Outcome Date GA Lbr Len/2nd Weight Sex Delivery Anes PTL Lv  4 Term 10/08/11 [redacted]w[redacted]d 110:15 / 00:38 6 lb 13.9 oz (3.116 kg) M Vag-Spont EPI  LIV  3 Term     M Vag-Spont   LIV  2 Ectopic           1 Ectopic             Past Medical History:  Diagnosis Date  . Anemia   . Anxiety   . Essential hypertension   . GERD (gastroesophageal reflux disease)   . Graves disease   . History of UTI   . Hyperthyroidism   . Supervision of normal intrauterine pregnancy in multigravida 10/08/2011  . Thyrotoxicosis     Past Surgical History:  Procedure Laterality Date  . ECTOPIC PREGNANCY SURGERY    . EYE SURGERY  09/09/2017   orbital decompression   . TUBAL LIGATION  10/08/2011   Procedure: POST PARTUM TUBAL LIGATION;  Surgeon: Mora Bellman, MD;  Location: [facility];  Service: Gynecology;  Laterality: Bilateral;  . WISDOM TOOTH EXTRACTION      Current Outpatient Medications on File Prior to Visit  Medication Sig Dispense Refill  . ALPRAZolam (XANAX) 0.25 MG tablet Take 0.25 mg by mouth at bedtime as needed for anxiety.    Marland Kitchen levothyroxine (SYNTHROID, LEVOTHROID) 112 MCG tablet Take 100 mcg by mouth daily before breakfast.     . omeprazole (PRILOSEC) 20 MG capsule Take 20 mg by mouth every morning.    Vladimir Faster Glycol-Propyl Glycol (SYSTANE OP) Apply 1-2 drops to eye 2 (two) times daily.     No current facility-administered medications on file prior to visit.     Allergies   Allergen Reactions  . Sulfa Antibiotics Palpitations    Rapid heart beats,tremores,anxiety    Social History   Socioeconomic History  . Marital status: Divorced    Spouse name: Not on file  . Number of children: 2  . Years of education: Not on file  . Highest education level: Not on file  Occupational History  . Not on file  Social Needs  . Financial resource strain: Not on file  . Food insecurity    Worry: Not on file    Inability: Not on file  . Transportation needs    Medical: Not on file    Non-medical: Not on file  Tobacco Use  . Smoking status: Former Smoker    Packs/day: 1.00    Years: 20.00    Pack years: 20.00    Types: Cigarettes    Quit date: 04/22/2018    Years since quitting: 1.2  . Smokeless tobacco: Never Used  Substance and Sexual Activity  . Alcohol use: Yes    Comment: occ  . Drug use: No  . Sexual activity: Not Currently    Birth control/protection: Surgical    Comment: tubal  Lifestyle  . Physical  activity    Days per week: Not on file    Minutes per session: Not on file  . Stress: Not on file  Relationships  . Social Musician on phone: Not on file    Gets together: Not on file    Attends religious service: Not on file    Active member of club or organization: Not on file    Attends meetings of clubs or organizations: Not on file    Relationship status: Not on file  . Intimate partner violence    Fear of current or ex partner: Not on file    Emotionally abused: Not on file    Physically abused: Not on file    Forced sexual activity: Not on file  Other Topics Concern  . Not on file  Social History Narrative   Married   2 children: Age 42 y/o and 55 mos old (as of 12 OV)        Family History  Problem Relation Age of Onset  . Diabetes Father   . Heart disease Father 67       MI  . High Cholesterol Mother   . Hypertension Mother   . Liver disease Sister   . Heart disease Maternal Grandmother   . Lupus Paternal  Grandmother     The following portions of the patient's history were reviewed and updated as appropriate: allergies, current medications, past family history, past medical history, past social history, past surgical history and problem list.  Review of Systems Pertinent items noted in HPI and remainder of comprehensive ROS otherwise negative.   Objective:  BP 124/83 (BP Location: Right Arm, Patient Position: Sitting, Cuff Size: Normal)   Pulse 89   Ht 5\' 2"  (1.575 m)   Wt 136 lb 9.6 oz (62 kg)   LMP 06/23/2019   BMI 24.98 kg/m  CONSTITUTIONAL: Well-developed, well-nourished female in no acute distress.  HENT:  Normocephalic, atraumatic, External right and left ear normal. Oropharynx is clear and moist EYES: Conjunctivae and EOM are normal. Pupils are equal, round, and reactive to light. No scleral icterus.  NECK: Normal range of motion, supple, no masses.  Normal thyroid.  SKIN: Skin is warm and dry. No rash noted. Not diaphoretic. No erythema. No pallor. NEUROLGIC: Alert and oriented to person, place, and time. Normal reflexes, muscle tone coordination. No cranial nerve deficit noted. PSYCHIATRIC: Normal mood and affect. Normal behavior. Normal judgment and thought content. CARDIOVASCULAR: Normal heart rate noted, regular rhythm RESPIRATORY: Clear to auscultation bilaterally. Effort and breath sounds normal, no problems with respiration noted. BREASTS: Symmetric in size. No masses, skin changes, nipple drainage, or lymphadenopathy. ABDOMEN: Soft, normal bowel sounds, no distention noted.  No tenderness, rebound or guarding.  PELVIC: Normal appearing external genitalia; normal appearing vaginal mucosa and cervix.  No abnormal discharge noted.  Pap smear obtained.  Normal uterine size, no other palpable masses, no uterine or adnexal tenderness. MUSCULOSKELETAL: Normal range of motion. No tenderness.  No cyanosis, clubbing, or edema.  2+ distal pulses.   Assessment:  Annual gynecologic  examination with pap smear Perimenopausal Sx Screening Mammogram Plan:  Will follow up results of pap smear and manage accordingly. Discussed Tx options for perimenopausal Sx. Pt declines at present. Mammogram scheduled Routine preventative health maintenance measures emphasized. Please refer to After Visit Summary for other counseling recommendations.    06/25/2019, MD, FACOG Attending Obstetrician & Gynecologist Center for [facility], [facility]

## 2019-07-19 LAB — CYTOLOGY - PAP
Diagnosis: NEGATIVE
High risk HPV: NEGATIVE

## 2019-08-09 ENCOUNTER — Other Ambulatory Visit (HOSPITAL_COMMUNITY): Payer: Self-pay | Admitting: Internal Medicine

## 2019-08-09 DIAGNOSIS — Z1231 Encounter for screening mammogram for malignant neoplasm of breast: Secondary | ICD-10-CM

## 2019-08-17 ENCOUNTER — Ambulatory Visit (HOSPITAL_COMMUNITY): Payer: Medicaid Other

## 2019-08-30 ENCOUNTER — Other Ambulatory Visit: Payer: Self-pay

## 2019-08-30 DIAGNOSIS — Z20822 Contact with and (suspected) exposure to covid-19: Secondary | ICD-10-CM

## 2019-08-31 LAB — NOVEL CORONAVIRUS, NAA: SARS-CoV-2, NAA: NOT DETECTED

## 2019-09-07 ENCOUNTER — Other Ambulatory Visit: Payer: Self-pay

## 2019-09-07 ENCOUNTER — Ambulatory Visit (HOSPITAL_COMMUNITY)
Admission: RE | Admit: 2019-09-07 | Discharge: 2019-09-07 | Disposition: A | Discharge status: Home / Self Care | Payer: Medicaid Other | Source: Ambulatory Visit | Attending: Internal Medicine | Admitting: Internal Medicine

## 2019-09-07 DIAGNOSIS — Z1231 Encounter for screening mammogram for malignant neoplasm of breast: Secondary | ICD-10-CM | POA: Insufficient documentation

## 2019-12-05 ENCOUNTER — Other Ambulatory Visit: Payer: Self-pay

## 2019-12-05 ENCOUNTER — Encounter: Payer: Self-pay | Admitting: Adult Health

## 2019-12-05 ENCOUNTER — Ambulatory Visit: Payer: Medicaid Other | Admitting: Adult Health

## 2019-12-05 VITALS — BP 145/87 | HR 83 | Ht 63.0 in | Wt 138.5 lb

## 2019-12-05 DIAGNOSIS — N898 Other specified noninflammatory disorders of vagina: Secondary | ICD-10-CM | POA: Diagnosis not present

## 2019-12-05 NOTE — Progress Notes (Signed)
  Subjective:     Patient ID: Kristin [name], female   DOB: 19-Aug-1976, 44 y.o.   MRN: [id number]  HPI Kristin [name] is a 44 year old divorced, white female, [id number], in complaining of vaginal discharge with odor.  PCP is Dr Sherwood Gambler.   Review of Systems  +vaginal discharge with odor about 5 days, seems to be better Has started having sex after 5 years     Objective:   Physical Exam BP (!) 145/87 (BP Location: Left Arm, Patient Position: Sitting, Cuff Size: Normal)   Pulse 83   Ht 5\' 3"  (1.6 m)   Wt 138 lb 8 oz (62.8 kg)   BMI 24.53 kg/m  Skin warm and dry.Pelvic: external genitalia is normal in appearance no lesions, vagina: scant white discharge without odor,urethra has no lesions or masses noted, cervix:smooth and bulbous, uterus: normal size, shape and contour, non tender, no masses felt, adnexa: no masses or tenderness noted. Bladder is non tender and no masses felt.Nuswab obtained.   Fall risk is low Examination chaperoned by Dr resident. Assessment:     1. Vaginal discharge Nuswab sent    Plan:     Nuswab sent, will talk when results back  Will follow up prn

## 2019-12-08 ENCOUNTER — Telehealth: Payer: Self-pay | Admitting: Adult Health

## 2019-12-08 LAB — NUSWAB VAGINITIS PLUS (VG+)
Candida albicans, NAA: NEGATIVE
Candida glabrata, NAA: NEGATIVE
Chlamydia trachomatis, NAA: NEGATIVE
Neisseria gonorrhoeae, NAA: NEGATIVE
Trich vag by NAA: NEGATIVE

## 2019-12-08 NOTE — Telephone Encounter (Signed)
Pt aware that Nuswab is negative

## 2020-08-13 ENCOUNTER — Ambulatory Visit: Payer: Medicaid Other | Admitting: Advanced Practice Midwife

## 2020-08-27 ENCOUNTER — Ambulatory Visit: Payer: Medicaid Other | Admitting: Advanced Practice Midwife

## 2020-10-06 DIAGNOSIS — Z419 Encounter for procedure for purposes other than remedying health state, unspecified: Secondary | ICD-10-CM | POA: Diagnosis not present

## 2020-11-06 DIAGNOSIS — Z419 Encounter for procedure for purposes other than remedying health state, unspecified: Secondary | ICD-10-CM | POA: Diagnosis not present

## 2020-11-28 DIAGNOSIS — E890 Postprocedural hypothyroidism: Secondary | ICD-10-CM | POA: Diagnosis not present

## 2020-11-28 DIAGNOSIS — E063 Autoimmune thyroiditis: Secondary | ICD-10-CM | POA: Diagnosis not present

## 2020-11-28 DIAGNOSIS — E559 Vitamin D deficiency, unspecified: Secondary | ICD-10-CM | POA: Diagnosis not present

## 2020-11-28 DIAGNOSIS — I10 Essential (primary) hypertension: Secondary | ICD-10-CM | POA: Diagnosis not present

## 2020-11-28 DIAGNOSIS — Z1331 Encounter for screening for depression: Secondary | ICD-10-CM | POA: Diagnosis not present

## 2020-11-28 DIAGNOSIS — Z6825 Body mass index (BMI) 25.0-25.9, adult: Secondary | ICD-10-CM | POA: Diagnosis not present

## 2020-11-28 DIAGNOSIS — Z0001 Encounter for general adult medical examination with abnormal findings: Secondary | ICD-10-CM | POA: Diagnosis not present

## 2020-11-28 DIAGNOSIS — G64 Other disorders of peripheral nervous system: Secondary | ICD-10-CM | POA: Diagnosis not present

## 2020-11-28 DIAGNOSIS — Z1389 Encounter for screening for other disorder: Secondary | ICD-10-CM | POA: Diagnosis not present

## 2020-11-28 DIAGNOSIS — H0520 Unspecified exophthalmos: Secondary | ICD-10-CM | POA: Diagnosis not present

## 2020-11-28 DIAGNOSIS — K219 Gastro-esophageal reflux disease without esophagitis: Secondary | ICD-10-CM | POA: Diagnosis not present

## 2020-11-28 DIAGNOSIS — F419 Anxiety disorder, unspecified: Secondary | ICD-10-CM | POA: Diagnosis not present

## 2020-11-28 DIAGNOSIS — E663 Overweight: Secondary | ICD-10-CM | POA: Diagnosis not present

## 2020-12-04 DIAGNOSIS — Z419 Encounter for procedure for purposes other than remedying health state, unspecified: Secondary | ICD-10-CM | POA: Diagnosis not present

## 2021-01-04 DIAGNOSIS — Z419 Encounter for procedure for purposes other than remedying health state, unspecified: Secondary | ICD-10-CM | POA: Diagnosis not present

## 2021-02-03 DIAGNOSIS — Z419 Encounter for procedure for purposes other than remedying health state, unspecified: Secondary | ICD-10-CM | POA: Diagnosis not present

## 2021-02-12 DIAGNOSIS — U071 COVID-19: Secondary | ICD-10-CM | POA: Diagnosis not present

## 2021-02-22 DIAGNOSIS — I10 Essential (primary) hypertension: Secondary | ICD-10-CM | POA: Diagnosis not present

## 2021-02-22 DIAGNOSIS — J329 Chronic sinusitis, unspecified: Secondary | ICD-10-CM | POA: Diagnosis not present

## 2021-02-22 DIAGNOSIS — E063 Autoimmune thyroiditis: Secondary | ICD-10-CM | POA: Diagnosis not present

## 2021-03-06 DIAGNOSIS — Z419 Encounter for procedure for purposes other than remedying health state, unspecified: Secondary | ICD-10-CM | POA: Diagnosis not present

## 2021-03-08 ENCOUNTER — Other Ambulatory Visit: Payer: Self-pay

## 2021-03-08 ENCOUNTER — Ambulatory Visit (INDEPENDENT_AMBULATORY_CARE_PROVIDER_SITE_OTHER): Payer: Medicaid Other | Admitting: Adult Health

## 2021-03-08 ENCOUNTER — Encounter: Payer: Self-pay | Admitting: Adult Health

## 2021-03-08 VITALS — BP 141/83 | HR 82 | Ht 64.0 in | Wt 143.0 lb

## 2021-03-08 DIAGNOSIS — N951 Menopausal and female climacteric states: Secondary | ICD-10-CM

## 2021-03-08 DIAGNOSIS — N920 Excessive and frequent menstruation with regular cycle: Secondary | ICD-10-CM | POA: Diagnosis not present

## 2021-03-08 NOTE — Progress Notes (Signed)
  Subjective:     Patient ID: Kristin [name], female   DOB: 10-Mar-1976, 45 y.o.   MRN: [id number]  HPI Kristin [name] is a 45 year old white female, with SO, [id number], in complaining of last period being heavy was changing every 1-2 hours and had cramps. She had taken prednisone recently, too. Had labs with PCP recently and was normal.  PCP is Dr Sherwood Gambler. Lab Results  Component Value Date   DIAGPAP  07/11/2019    - Negative for intraepithelial lesion or malignancy (NILM)   HPVHIGH Negative 07/11/2019   Review of Systems Heavy period with cramps +night sweats Reviewed past medical,surgical, social and family history. Reviewed medications and allergies.     Objective:   Physical Exam BP (!) 141/83 (BP Location: Right Arm, Patient Position: Sitting, Cuff Size: Normal)   Pulse 82   Ht 5\' 4"  (1.626 m)   Wt 143 lb (64.9 kg)   LMP 03/02/2021   BMI 24.55 kg/m    Skin warm and dry.Pelvic: external genitalia is normal in appearance no lesions, vagina: +dark period blood,urethra has no lesions or masses noted, cervix:smooth and bulbous, uterus: normal size, shape and contour, non tender, no masses felt, adnexa: no masses or tenderness noted. Bladder is non tender and no masses felt.  Upstream - 03/08/21 1321      Pregnancy Intention Screening   Does the patient want to become pregnant in the next year? No    Does the patient's partner want to become pregnant in the next year? No    Would the patient like to discuss contraceptive options today? No      Contraception Wrap Up   Current Method Female Sterilization    End Method Female Sterilization    Contraception Counseling Provided No         Examination chaperoned by 05/08/21  Assessment:     1. Menorrhagia with regular cycle Will get pelvic Psychologist, prison and probation services 6/8/ at 3:30 pm at [facility], and will talk when results back  2. Peri-menopause     Plan:     Follow up prn

## 2021-03-13 ENCOUNTER — Ambulatory Visit (HOSPITAL_COMMUNITY)
Admission: RE | Admit: 2021-03-13 | Discharge: 2021-03-13 | Disposition: A | Discharge status: Home / Self Care | Payer: Medicaid Other | Source: Ambulatory Visit | Attending: Adult Health | Admitting: Adult Health

## 2021-03-13 ENCOUNTER — Other Ambulatory Visit: Payer: Self-pay

## 2021-03-13 DIAGNOSIS — N920 Excessive and frequent menstruation with regular cycle: Secondary | ICD-10-CM | POA: Insufficient documentation

## 2021-03-13 DIAGNOSIS — N939 Abnormal uterine and vaginal bleeding, unspecified: Secondary | ICD-10-CM | POA: Diagnosis not present

## 2021-03-14 ENCOUNTER — Telehealth: Payer: Self-pay | Admitting: Adult Health

## 2021-03-14 NOTE — Telephone Encounter (Signed)
Pt aware of US results. 

## 2021-04-05 DIAGNOSIS — Z419 Encounter for procedure for purposes other than remedying health state, unspecified: Secondary | ICD-10-CM | POA: Diagnosis not present

## 2021-05-06 DIAGNOSIS — Z419 Encounter for procedure for purposes other than remedying health state, unspecified: Secondary | ICD-10-CM | POA: Diagnosis not present

## 2021-06-06 DIAGNOSIS — Z419 Encounter for procedure for purposes other than remedying health state, unspecified: Secondary | ICD-10-CM | POA: Diagnosis not present

## 2021-06-27 DIAGNOSIS — I10 Essential (primary) hypertension: Secondary | ICD-10-CM | POA: Diagnosis not present

## 2021-06-27 DIAGNOSIS — E890 Postprocedural hypothyroidism: Secondary | ICD-10-CM | POA: Diagnosis not present

## 2021-07-03 ENCOUNTER — Other Ambulatory Visit (INDEPENDENT_AMBULATORY_CARE_PROVIDER_SITE_OTHER): Payer: Medicaid Other

## 2021-07-03 ENCOUNTER — Other Ambulatory Visit: Payer: Self-pay

## 2021-07-03 DIAGNOSIS — R399 Unspecified symptoms and signs involving the genitourinary system: Secondary | ICD-10-CM | POA: Diagnosis not present

## 2021-07-03 LAB — POCT URINALYSIS DIPSTICK OB
Glucose, UA: NEGATIVE
Ketones, UA: NEGATIVE
Leukocytes, UA: NEGATIVE
Nitrite, UA: NEGATIVE
POC,PROTEIN,UA: NEGATIVE

## 2021-07-03 MED ORDER — PHENAZOPYRIDINE HCL 200 MG PO TABS: 200.0000 mg | ORAL_TABLET | Freq: Three times a day (TID) | ORAL | 0 refills | Status: DC | PRN

## 2021-07-03 NOTE — Progress Notes (Signed)
   NURSE VISIT- UTI SYMPTOMS   SUBJECTIVE:  Kristin [name] is a 45 y.o. [phone] female here for UTI symptoms. She is a GYN patient. She reports urinary frequency, urinary urgency, and pain after urination .  OBJECTIVE:  There were no vitals taken for this visit.  Appears well, in no apparent distress  Results for orders placed or performed in visit on 07/03/21 (from the past 24 hour(s))  POC Urinalysis Dipstick OB   Collection Time: 07/03/21 11:55 AM  Result Value Ref Range   Color, UA     Clarity, UA     Glucose, UA Negative Negative   Bilirubin, UA     Ketones, UA neg    Spec Grav, UA     Blood, UA small    pH, UA     POC,PROTEIN,UA Negative Negative, Trace, Small (1+), Moderate (2+), Large (3+), 4+   Urobilinogen, UA     Nitrite, UA neg    Leukocytes, UA Negative Negative   Appearance     Odor      ASSESSMENT: GYN patient with UTI symptoms and negative nitrites  PLAN: Note routed to Cyril Mourning, AGNP   Rx sent by provider today: No Urine culture sent Call or return to clinic prn if these symptoms worsen or fail to improve as anticipated. Follow-up: as needed   Kristin [name]  07/03/2021 11:56 AM

## 2021-07-03 NOTE — Progress Notes (Signed)
Chart reviewed for nurse visit. Agree with plan of care. Will rx pyridium for burning and await culture  Adline Potter, NP 07/03/2021 12:27 PM

## 2021-07-03 NOTE — Addendum Note (Signed)
Addended by: Cyril Mourning A on: 07/03/2021 12:28 PM   Modules accepted: Orders

## 2021-07-04 DIAGNOSIS — E890 Postprocedural hypothyroidism: Secondary | ICD-10-CM | POA: Diagnosis not present

## 2021-07-04 DIAGNOSIS — I10 Essential (primary) hypertension: Secondary | ICD-10-CM | POA: Diagnosis not present

## 2021-07-04 DIAGNOSIS — E0500 Thyrotoxicosis with diffuse goiter without thyrotoxic crisis or storm: Secondary | ICD-10-CM | POA: Diagnosis not present

## 2021-07-04 LAB — URINALYSIS, ROUTINE W REFLEX MICROSCOPIC
Bilirubin, UA: NEGATIVE
Glucose, UA: NEGATIVE
Ketones, UA: NEGATIVE
Leukocytes,UA: NEGATIVE
Nitrite, UA: NEGATIVE
Protein,UA: NEGATIVE
RBC, UA: NEGATIVE
Specific Gravity, UA: 1.023 (ref 1.005–1.030)
Urobilinogen, Ur: 0.2 mg/dL (ref 0.2–1.0)
pH, UA: 5.5 (ref 5.0–7.5)

## 2021-07-06 DIAGNOSIS — Z419 Encounter for procedure for purposes other than remedying health state, unspecified: Secondary | ICD-10-CM | POA: Diagnosis not present

## 2021-07-13 LAB — URINE CULTURE

## 2021-07-15 ENCOUNTER — Other Ambulatory Visit: Payer: Self-pay | Admitting: Adult Health

## 2021-07-15 MED ORDER — CIPROFLOXACIN HCL 500 MG PO TABS: 500.0000 mg | ORAL_TABLET | Freq: Two times a day (BID) | ORAL | 0 refills | Status: DC

## 2021-07-15 NOTE — Progress Notes (Signed)
Urine is +Klebsiella pneumonia will rx cipro

## 2021-08-06 DIAGNOSIS — Z419 Encounter for procedure for purposes other than remedying health state, unspecified: Secondary | ICD-10-CM | POA: Diagnosis not present

## 2021-09-03 DIAGNOSIS — E063 Autoimmune thyroiditis: Secondary | ICD-10-CM | POA: Diagnosis not present

## 2021-09-03 DIAGNOSIS — G629 Polyneuropathy, unspecified: Secondary | ICD-10-CM | POA: Diagnosis not present

## 2021-09-03 DIAGNOSIS — E663 Overweight: Secondary | ICD-10-CM | POA: Diagnosis not present

## 2021-09-03 DIAGNOSIS — M7122 Synovial cyst of popliteal space [Baker], left knee: Secondary | ICD-10-CM | POA: Diagnosis not present

## 2021-09-03 DIAGNOSIS — I10 Essential (primary) hypertension: Secondary | ICD-10-CM | POA: Diagnosis not present

## 2021-09-03 DIAGNOSIS — Z6825 Body mass index (BMI) 25.0-25.9, adult: Secondary | ICD-10-CM | POA: Diagnosis not present

## 2021-09-05 DIAGNOSIS — Z419 Encounter for procedure for purposes other than remedying health state, unspecified: Secondary | ICD-10-CM | POA: Diagnosis not present

## 2021-10-06 DIAGNOSIS — Z419 Encounter for procedure for purposes other than remedying health state, unspecified: Secondary | ICD-10-CM | POA: Diagnosis not present

## 2021-10-15 DIAGNOSIS — H02534 Eyelid retraction left upper eyelid: Secondary | ICD-10-CM | POA: Diagnosis not present

## 2021-10-15 DIAGNOSIS — H5789 Other specified disorders of eye and adnexa: Secondary | ICD-10-CM | POA: Diagnosis not present

## 2021-10-15 DIAGNOSIS — E079 Disorder of thyroid, unspecified: Secondary | ICD-10-CM | POA: Diagnosis not present

## 2021-10-16 DIAGNOSIS — E063 Autoimmune thyroiditis: Secondary | ICD-10-CM | POA: Diagnosis not present

## 2021-10-16 DIAGNOSIS — E663 Overweight: Secondary | ICD-10-CM | POA: Diagnosis not present

## 2021-10-16 DIAGNOSIS — I10 Essential (primary) hypertension: Secondary | ICD-10-CM | POA: Diagnosis not present

## 2021-10-16 DIAGNOSIS — Z6825 Body mass index (BMI) 25.0-25.9, adult: Secondary | ICD-10-CM | POA: Diagnosis not present

## 2021-10-16 DIAGNOSIS — M779 Enthesopathy, unspecified: Secondary | ICD-10-CM | POA: Diagnosis not present

## 2021-10-16 DIAGNOSIS — M25562 Pain in left knee: Secondary | ICD-10-CM | POA: Diagnosis not present

## 2021-10-16 DIAGNOSIS — Z1331 Encounter for screening for depression: Secondary | ICD-10-CM | POA: Diagnosis not present

## 2021-10-16 DIAGNOSIS — G629 Polyneuropathy, unspecified: Secondary | ICD-10-CM | POA: Diagnosis not present

## 2021-11-06 DIAGNOSIS — Z419 Encounter for procedure for purposes other than remedying health state, unspecified: Secondary | ICD-10-CM | POA: Diagnosis not present

## 2021-12-04 DIAGNOSIS — Z419 Encounter for procedure for purposes other than remedying health state, unspecified: Secondary | ICD-10-CM | POA: Diagnosis not present

## 2021-12-05 DIAGNOSIS — H02534 Eyelid retraction left upper eyelid: Secondary | ICD-10-CM | POA: Diagnosis not present

## 2022-01-04 DIAGNOSIS — Z419 Encounter for procedure for purposes other than remedying health state, unspecified: Secondary | ICD-10-CM | POA: Diagnosis not present

## 2022-02-03 DIAGNOSIS — Z419 Encounter for procedure for purposes other than remedying health state, unspecified: Secondary | ICD-10-CM | POA: Diagnosis not present

## 2022-02-11 ENCOUNTER — Emergency Department (HOSPITAL_COMMUNITY): Payer: Medicaid Other

## 2022-02-11 ENCOUNTER — Emergency Department (HOSPITAL_COMMUNITY)
Admission: EM | Admit: 2022-02-11 | Discharge: 2022-02-12 | Disposition: A | Discharge status: Home / Self Care | Payer: Medicaid Other | Attending: Emergency Medicine | Admitting: Emergency Medicine

## 2022-02-11 ENCOUNTER — Encounter (HOSPITAL_COMMUNITY): Payer: Self-pay | Admitting: *Deleted

## 2022-02-11 ENCOUNTER — Other Ambulatory Visit: Payer: Self-pay

## 2022-02-11 DIAGNOSIS — R0789 Other chest pain: Secondary | ICD-10-CM | POA: Insufficient documentation

## 2022-02-11 DIAGNOSIS — R079 Chest pain, unspecified: Secondary | ICD-10-CM | POA: Diagnosis not present

## 2022-02-11 LAB — CBC WITH DIFFERENTIAL/PLATELET
Abs Immature Granulocytes: 0.03 10*3/uL (ref 0.00–0.07)
Basophils Absolute: 0 10*3/uL (ref 0.0–0.1)
Basophils Relative: 1 %
Eosinophils Absolute: 0.4 10*3/uL (ref 0.0–0.5)
Eosinophils Relative: 5 %
HCT: 42.9 % (ref 36.0–46.0)
Hemoglobin: 13.9 g/dL (ref 12.0–15.0)
Immature Granulocytes: 0 %
Lymphocytes Relative: 33 %
Lymphs Abs: 2.9 10*3/uL (ref 0.7–4.0)
MCH: 29.9 pg (ref 26.0–34.0)
MCHC: 32.4 g/dL (ref 30.0–36.0)
MCV: 92.3 fL (ref 80.0–100.0)
Monocytes Absolute: 0.8 10*3/uL (ref 0.1–1.0)
Monocytes Relative: 9 %
Neutro Abs: 4.6 10*3/uL (ref 1.7–7.7)
Neutrophils Relative %: 52 %
Platelets: 218 10*3/uL (ref 150–400)
RBC: 4.65 MIL/uL (ref 3.87–5.11)
RDW: 13.6 % (ref 11.5–15.5)
WBC: 8.8 10*3/uL (ref 4.0–10.5)
nRBC: 0 % (ref 0.0–0.2)

## 2022-02-11 LAB — COMPREHENSIVE METABOLIC PANEL
ALT: 50 U/L — ABNORMAL HIGH (ref 0–44)
AST: 38 U/L (ref 15–41)
Albumin: 4.1 g/dL (ref 3.5–5.0)
Alkaline Phosphatase: 125 U/L (ref 38–126)
Anion gap: 7 (ref 5–15)
BUN: 22 mg/dL — ABNORMAL HIGH (ref 6–20)
CO2: 21 mmol/L — ABNORMAL LOW (ref 22–32)
Calcium: 8.9 mg/dL (ref 8.9–10.3)
Chloride: 111 mmol/L (ref 98–111)
Creatinine, Ser: 0.74 mg/dL (ref 0.44–1.00)
GFR, Estimated: >60 mL/min (ref >60)
Glucose, Bld: 104 mg/dL — ABNORMAL HIGH (ref 70–99)
Potassium: 4 mmol/L (ref 3.5–5.1)
Sodium: 139 mmol/L (ref 135–145)
Total Bilirubin: 0.4 mg/dL (ref 0.3–1.2)
Total Protein: 7.3 g/dL (ref 6.5–8.1)

## 2022-02-11 LAB — LIPASE, BLOOD: Lipase: 34 U/L (ref 11–51)

## 2022-02-11 LAB — TROPONIN I (HIGH SENSITIVITY): Troponin I (High Sensitivity): 4 ng/L (ref <18)

## 2022-02-11 MED ORDER — ALUM & MAG HYDROXIDE-SIMETH 200-200-20 MG/5ML PO SUSP
30.0000 mL | Freq: Once | ORAL | Status: AC
  Administered 2022-02-11: 30 mL via ORAL
  Filled 2022-02-11: qty 30

## 2022-02-11 MED ORDER — FAMOTIDINE 20 MG PO TABS
40.0000 mg | ORAL_TABLET | Freq: Once | ORAL | Status: AC
  Administered 2022-02-11: 40 mg via ORAL
  Filled 2022-02-11: qty 2

## 2022-02-11 MED ORDER — LIDOCAINE VISCOUS HCL 2 % MT SOLN
15.0000 mL | Freq: Once | OROMUCOSAL | Status: DC
  Filled 2022-02-11: qty 15

## 2022-02-11 NOTE — ED Triage Notes (Signed)
Pt with mid to left cp since around 2000 tonight, pt questions if it is heart burn due to pt eating spaghetti around 1800 this evening. Also c/o HA  ?

## 2022-02-11 NOTE — ED Provider Notes (Signed)
?[facility] ?Provider Note ? ? ?CSN: [id number] ?Arrival date & time: 02/11/22  2214 ? ?  ? ?History ? ?Chief Complaint  ?Patient presents with  ? Chest Pain  ? ? ?Kristin [name] is a 46 y.o. female. ? ?Patient presents to the emergency department with complaints of chest pain.  Patient reports that the chest pain began around 8 PM.  Patient reports that it might be heartburn secondary to eating spaghetti.  She reports that this used to happen to her frequently but has not happened in quite some time.  She had eaten about 1-1/2 hours before onset of symptoms.  At time of my evaluation symptoms are easing off and almost gone. ? ? ?  ? ?Home Medications ?Prior to Admission medications   ?Medication Sig Start Date End Date Taking? Authorizing Provider  ?pantoprazole (PROTONIX) 40 MG tablet Take 1 tablet (40 mg total) by mouth daily. 02/12/22  Yes Chelly Dombeck, Gwenyth Allegra, MD  ?acetaminophen (TYLENOL) 500 MG tablet Take by mouth.    [provider]  ?ALPRAZolam Duanne Moron) 0.5 MG tablet Take 0.5 mg by mouth 4 (four) times daily as needed. 03/06/21   [provider]  ?ciprofloxacin (CIPRO) 500 MG tablet Take 1 tablet (500 mg total) by mouth 2 (two) times daily. 07/15/21   Estill Dooms, NP  ?levothyroxine (SYNTHROID, LEVOTHROID) 112 MCG tablet Take 100 mcg by mouth daily before breakfast.     [provider]  ?phenazopyridine (PYRIDIUM) 200 MG tablet Take 1 tablet (200 mg total) by mouth 3 (three) times daily as needed for pain. 07/03/21   Estill Dooms, NP  ?Vitamin D, Ergocalciferol, (DRISDOL) 1.25 MG (50000 UNIT) CAPS capsule Take 1 capsule by mouth once a week. 02/25/21   [provider]  ?   ? ?Allergies    ?Sulfa antibiotics   ? ?Review of Systems   ?Review of Systems ? ?Physical Exam ?Updated Vital Signs ?BP (!) 141/80   Pulse 70   Temp 98.1 ?F (36.7 ?C) (Oral)   Resp 16   Ht 5\' 4"  (1.626 m)   Wt 65.8 kg   LMP 02/07/2022   SpO2 99%   BMI 24.89 kg/m?   ?Physical Exam ?Vitals and nursing note reviewed.  ?Constitutional:   ?   General: She is not in acute distress. ?   Appearance: She is well-developed.  ?HENT:  ?   Head: Normocephalic and atraumatic.  ?   Mouth/Throat:  ?   Mouth: Mucous membranes are moist.  ?Eyes:  ?   General: Vision grossly intact. Gaze aligned appropriately.  ?   Extraocular Movements: Extraocular movements intact.  ?   Conjunctiva/sclera: Conjunctivae normal.  ?Cardiovascular:  ?   Rate and Rhythm: Normal rate and regular rhythm.  ?   Pulses: Normal pulses.  ?   Heart sounds: Normal heart sounds, S1 normal and S2 normal. No murmur heard. ?  No friction rub. No gallop.  ?Pulmonary:  ?   Effort: Pulmonary effort is normal. No respiratory distress.  ?   Breath sounds: Normal breath sounds.  ?Abdominal:  ?   General: Bowel sounds are normal.  ?   Palpations: Abdomen is soft.  ?   Tenderness: There is no abdominal tenderness. There is no guarding or rebound.  ?   Hernia: No hernia is present.  ?Musculoskeletal:     ?   General: No swelling.  ?   Cervical back: Full passive range of motion without pain, normal range of  motion and neck supple. No spinous process tenderness or muscular tenderness. Normal range of motion.  ?   Right lower leg: No edema.  ?   Left lower leg: No edema.  ?Skin: ?   General: Skin is warm and dry.  ?   Capillary Refill: Capillary refill takes less than 2 seconds.  ?   Findings: No ecchymosis, erythema, rash or wound.  ?Neurological:  ?   General: No focal deficit present.  ?   Mental Status: She is alert and oriented to person, place, and time.  ?   GCS: GCS eye subscore is 4. GCS verbal subscore is 5. GCS motor subscore is 6.  ?   Cranial Nerves: Cranial nerves 2-12 are intact.  ?   Sensory: Sensation is intact.  ?   Motor: Motor function is intact.  ?   Coordination: Coordination is intact.  ?Psychiatric:     ?   Attention and Perception: Attention normal.     ?   Mood and Affect: Mood normal.     ?   Speech: Speech  normal.     ?   Behavior: Behavior normal.  ? ? ?ED Results / Procedures / Treatments   ?Labs ?(all labs ordered are listed, but only abnormal results are displayed) ?Labs Reviewed  ?COMPREHENSIVE METABOLIC PANEL - Abnormal; Notable for the following components:  ?    Result Value  ? CO2 21 (*)   ? Glucose, Bld 104 (*)   ? BUN 22 (*)   ? ALT 50 (*)   ? All other components within normal limits  ?LIPASE, BLOOD  ?CBC WITH DIFFERENTIAL/PLATELET  ?PREGNANCY, URINE  ?TROPONIN I (HIGH SENSITIVITY)  ?TROPONIN I (HIGH SENSITIVITY)  ? ? ?EKG ?EKG Interpretation ? ?Date/Time:  Tuesday Feb 11 2022 22:24:57 EDT ?Ventricular Rate:  81 ?PR Interval:  140 ?QRS Duration: 106 ?QT Interval:  399 ?QTC Calculation: 464 ?R Axis:   90 ?Text Interpretation: Sinus rhythm Consider right ventricular hypertrophy Nonspecific T abnrm, anterolateral leads Confirmed by Nanda Quinton [phone]) on 02/11/2022 10:32:40 PM ? ?Radiology ?DG Chest 2 View ? ?Result Date: 02/11/2022 ?CLINICAL DATA:  Chest pain EXAM: CHEST - 2 VIEW COMPARISON:  07/23/2018 FINDINGS: The heart size and mediastinal contours are within normal limits. Both lungs are clear. The visualized skeletal structures are unremarkable. IMPRESSION: No active cardiopulmonary disease. Electronically Signed   By: Donavan Foil M.D.   On: 02/11/2022 23:24   ? ?Procedures ?Procedures  ? ? ?Medications Ordered in ED ?Medications  ?alum & mag hydroxide-simeth (MAALOX/MYLANTA) 200-200-20 MG/5ML suspension 30 mL (30 mLs Oral Given 02/11/22 2341)  ?  And  ?lidocaine (XYLOCAINE) 2 % viscous mouth solution 15 mL (15 mLs Oral Not Given 02/11/22 2341)  ?famotidine (PEPCID) tablet 40 mg (40 mg Oral Given 02/11/22 2341)  ? ? ?ED Course/ Medical Decision Making/ A&P ?  ?                        ?Medical Decision Making ?Risk ?OTC drugs. ?Prescription drug management. ? ? ?Presents to the ER for evaluation of chest pain.  Differential diagnosis considered includes unstable angina, NSTEMI, gastritis, GERD, cholecystitis.   Patient reports onset of symptoms after eating spaghetti.  She does have a history of GERD but has not had problems in years.  Patient experiencing significant improvement of her symptoms without intervention by the time I saw her.  She had further improvement after GI cocktail and Pepcid.  Her work-up  has been reassuring.  She has follow-up with her doctor in the morning.  Will discharge with Protonix, follow-up with PCP. ? ? ? ? ? ? ? ?Final Clinical Impression(s) / ED Diagnoses ?Final diagnoses:  ?Atypical chest pain  ? ? ?Rx / DC Orders ?ED Discharge Orders   ? ?      Ordered  ?  pantoprazole (PROTONIX) 40 MG tablet  Daily       ? 02/12/22 0120  ? ?  ?  ? ?  ? ? ?  ?Orpah Greek, MD ?02/12/22 0123 ? ?

## 2022-02-12 DIAGNOSIS — G629 Polyneuropathy, unspecified: Secondary | ICD-10-CM | POA: Diagnosis not present

## 2022-02-12 DIAGNOSIS — E063 Autoimmune thyroiditis: Secondary | ICD-10-CM | POA: Diagnosis not present

## 2022-02-12 DIAGNOSIS — R0789 Other chest pain: Secondary | ICD-10-CM | POA: Diagnosis not present

## 2022-02-12 DIAGNOSIS — E559 Vitamin D deficiency, unspecified: Secondary | ICD-10-CM | POA: Diagnosis not present

## 2022-02-12 LAB — PREGNANCY, URINE: Preg Test, Ur: NEGATIVE

## 2022-02-12 LAB — TROPONIN I (HIGH SENSITIVITY): Troponin I (High Sensitivity): 4 ng/L (ref <18)

## 2022-02-12 MED ORDER — PANTOPRAZOLE SODIUM 40 MG PO TBEC: 40.0000 mg | DELAYED_RELEASE_TABLET | Freq: Every day | ORAL | 3 refills | Status: AC

## 2022-03-05 ENCOUNTER — Other Ambulatory Visit: Payer: Self-pay

## 2022-03-05 ENCOUNTER — Ambulatory Visit
Admission: EM | Admit: 2022-03-05 | Discharge: 2022-03-05 | Disposition: A | Discharge status: Home / Self Care | Payer: Medicaid Other | Attending: Nurse Practitioner | Admitting: Nurse Practitioner

## 2022-03-05 ENCOUNTER — Encounter: Payer: Self-pay | Admitting: Emergency Medicine

## 2022-03-05 DIAGNOSIS — B9789 Other viral agents as the cause of diseases classified elsewhere: Secondary | ICD-10-CM

## 2022-03-05 DIAGNOSIS — J329 Chronic sinusitis, unspecified: Secondary | ICD-10-CM | POA: Diagnosis not present

## 2022-03-05 MED ORDER — IPRATROPIUM BROMIDE 0.03 % NA SOLN: 2.0000 | Freq: Two times a day (BID) | NASAL | 12 refills | Status: DC

## 2022-03-05 MED ORDER — CETIRIZINE HCL 10 MG PO TABS
10.0000 mg | ORAL_TABLET | Freq: Every day | ORAL | 0 refills | Status: DC
Start: 2022-03-05 — End: 2022-05-12

## 2022-03-05 MED ORDER — IBUPROFEN 800 MG PO TABS: 800.0000 mg | ORAL_TABLET | Freq: Three times a day (TID) | ORAL | 0 refills | Status: DC | PRN

## 2022-03-05 NOTE — Discharge Instructions (Addendum)
Take medication as prescribed. Increase fluids and allow for plenty of rest. Saline nasal spray to use throughout the day to help with any nasal congestion. Recommend using a humidifier to add moisture to your environment. Follow-up in 5 to 7 days if your symptoms worsen or do not improve.

## 2022-03-05 NOTE — ED Provider Notes (Signed)
[facility]    CSN: [id number] Arrival date & time: 03/05/22  1233      History   Chief Complaint Chief Complaint  Patient presents with   Facial Pain    HPI Kristin [name] is a 46 y.o. female.   The history is provided by the patient.    Patient presents for complaints of facial pressure, headache, and nasal congestion.  Symptoms started last evening.  Patient states symptoms are localized to the left side of her face and head.  She denies fever, chills, eye symptoms, cough, wheezing, shortness of breath, or GI symptoms.  Patient states she has been taking Tylenol for her symptoms.  States that this normally happens to her "a couple times each year".  Reports she has been using Flonase for the past 2 months.  Past Medical History:  Diagnosis Date   Anemia    Anxiety    Essential hypertension    GERD (gastroesophageal reflux disease)    Graves disease    History of UTI    Hyperthyroidism    Supervision of normal intrauterine pregnancy in multigravida 10/08/2011   Thyrotoxicosis     Patient Active Problem List   Diagnosis Date Noted   Menorrhagia with regular cycle 03/08/2021   Peri-menopause 03/08/2021   Well woman exam with routine gynecological exam 07/11/2019   Tobacco use disorder 12/13/2013   Family history of early CAD 12/13/2013   Hypothyroidism (acquired) 11/20/2013   Anxiety    GERD (gastroesophageal reflux disease)    S/P tubal ligation 10/09/2011   Anemia 07/03/2011    Past Surgical History:  Procedure Laterality Date   ECTOPIC PREGNANCY SURGERY     EYE SURGERY  09/09/2017   orbital decompression    TUBAL LIGATION  10/08/2011   Procedure: POST PARTUM TUBAL LIGATION;  Surgeon: Catalina AntiguaPeggy Constant, MD;  Location: [facility];  Service: Gynecology;  Laterality: Bilateral;   WISDOM TOOTH EXTRACTION      OB History     Gravida  4   Para  2   Term  2   Preterm      AB  2   Living  2      SAB      IAB      Ectopic  2   Multiple       Live Births  2            Home Medications    Prior to Admission medications   Medication Sig Start Date End Date Taking? Authorizing Provider  acetaminophen (TYLENOL) 500 MG tablet Take by mouth.   Yes [provider]  ALPRAZolam Prudy Feeler(XANAX) 0.5 MG tablet Take 0.5 mg by mouth 4 (four) times daily as needed. 03/06/21  Yes [provider]  cetirizine (ZYRTEC) 10 MG tablet Take 1 tablet (10 mg total) by mouth daily. 03/05/22  Yes Shakiera Edelson-Warren, Sadie Haberhristie J, NP  ibuprofen (ADVIL) 800 MG tablet Take 1 tablet (800 mg total) by mouth every 8 (eight) hours as needed. 03/05/22  Yes Zaylia Riolo-Warren, Sadie Haberhristie J, NP  ipratropium (ATROVENT) 0.03 % nasal spray Place 2 sprays into both nostrils every 12 (twelve) hours. 03/05/22  Yes Zeynep Fantroy-Warren, Sadie Haberhristie J, NP  levothyroxine (SYNTHROID, LEVOTHROID) 112 MCG tablet Take 100 mcg by mouth daily before breakfast.    Yes [provider]  pantoprazole (PROTONIX) 40 MG tablet Take 1 tablet (40 mg total) by mouth daily. 02/12/22  Yes Pollina, Canary Brimhristopher J, MD  ciprofloxacin (CIPRO) 500 MG tablet Take 1 tablet (500  mg total) by mouth 2 (two) times daily. 07/15/21   Adline Potter, NP  phenazopyridine (PYRIDIUM) 200 MG tablet Take 1 tablet (200 mg total) by mouth 3 (three) times daily as needed for pain. 07/03/21   Adline Potter, NP  Vitamin D, Ergocalciferol, (DRISDOL) 1.25 MG (50000 UNIT) CAPS capsule Take 1 capsule by mouth once a week. 02/25/21   [provider]    Family History Family History  Problem Relation Age of Onset   Diabetes Father    Heart disease Father 28       MI   High Cholesterol Mother    Hypertension Mother    Liver disease Sister    Heart disease Maternal Grandmother    Lupus Paternal Grandmother     Social History Social History   Tobacco Use   Smoking status: Every Day    Packs/day: 1.00    Years: 20.00    Pack years: 20.00    Types: Cigarettes    Last attempt to quit: 04/22/2018     Years since quitting: 3.8   Smokeless tobacco: Never  Vaping Use   Vaping Use: Never used  Substance Use Topics   Alcohol use: Yes    Comment: occ   Drug use: No     Allergies   Sulfa antibiotics   Review of Systems Review of Systems Per HPI  Physical Exam Triage Vital Signs ED Triage Vitals [03/05/22 1407]  Enc Vitals Group     BP (!) 154/95     Pulse Rate 69     Resp 18     Temp 98.3 F (36.8 C)     Temp Source Oral     SpO2 95 %     Weight 142 lb (64.4 kg)     Height 5\' 3"  (1.6 m)     Head Circumference      Peak Flow      Pain Score 10     Pain Loc      Pain Edu?      Excl. in GC?    No data found.  Updated Vital Signs BP (!) 154/95 (BP Location: Right Arm)   Pulse 69   Temp 98.3 F (36.8 C) (Oral)   Resp 18   Ht 5\' 3"  (1.6 m)   Wt 142 lb (64.4 kg)   LMP 02/07/2022   SpO2 95%   BMI 25.15 kg/m   Visual Acuity Right Eye Distance:   Left Eye Distance:   Bilateral Distance:    Right Eye Near:   Left Eye Near:    Bilateral Near:     Physical Exam Vitals and nursing note reviewed.  Constitutional:      General: She is not in acute distress.    Appearance: Normal appearance.  HENT:     Head: Normocephalic.     Right Ear: Tympanic membrane, ear canal and external ear normal.     Left Ear: Tympanic membrane, ear canal and external ear normal.     Nose: Congestion present.     Right Turbinates: Enlarged and swollen.     Left Turbinates: Enlarged and swollen.     Right Sinus: No maxillary sinus tenderness or frontal sinus tenderness.     Left Sinus: Maxillary sinus tenderness and frontal sinus tenderness present.     Mouth/Throat:     Pharynx: No oropharyngeal exudate or posterior oropharyngeal erythema.  Eyes:     Extraocular Movements: Extraocular movements intact.     Conjunctiva/sclera: Conjunctivae  normal.     Pupils: Pupils are equal, round, and reactive to light.  Cardiovascular:     Rate and Rhythm: Normal rate and regular rhythm.      Pulses: Normal pulses.     Heart sounds: Normal heart sounds.  Pulmonary:     Effort: Pulmonary effort is normal.     Breath sounds: Normal breath sounds.  Abdominal:     General: Bowel sounds are normal.     Palpations: Abdomen is soft.  Musculoskeletal:     Cervical back: Normal range of motion.  Skin:    General: Skin is warm and dry.     Capillary Refill: Capillary refill takes less than 2 seconds.  Neurological:     General: No focal deficit present.     Mental Status: She is alert and oriented to person, place, and time.  Psychiatric:        Mood and Affect: Mood normal.        Behavior: Behavior normal.     UC Treatments / Results  Labs (all labs ordered are listed, but only abnormal results are displayed) Labs Reviewed - No data to display  EKG   Radiology No results found.  Procedures Procedures (including critical care time)  Medications Ordered in UC Medications - No data to display  Initial Impression / Assessment and Plan / UC Course  I have reviewed the triage vital signs and the nursing notes.  Pertinent labs & imaging results that were available during my care of the patient were reviewed by me and considered in my medical decision making (see chart for details).  Patient presents with left-sided facial pain, headache, and nasal congestion.  Symptoms have been present for 1 night.  Her exam is reassuring, her vital signs are stable, would like to start patient on symptomatic treatment to include cetirizine and ipratropium.  Patient has been using Flonase for the past 2 months.  Discussed with patient that symptoms are most likely viral at this time.  Advised patient that if her symptoms do not improve within the next 5 to 7 days, to follow-up for reevaluation.  Recommendations for supportive care was provided.  Patient advised to follow-up if symptoms do not improve. Final Clinical Impressions(s) / UC Diagnoses   Final diagnoses:  Viral sinusitis      Discharge Instructions      Take medication as prescribed. Increase fluids and allow for plenty of rest. Saline nasal spray to use throughout the day to help with any nasal congestion. Recommend using a humidifier to add moisture to your environment. Follow-up in 5 to 7 days if your symptoms worsen or do not improve.    ED Prescriptions     Medication Sig Dispense Auth. Provider   ibuprofen (ADVIL) 800 MG tablet Take 1 tablet (800 mg total) by mouth every 8 (eight) hours as needed. 21 tablet Edyn Popoca-Warren, Sadie Haber, NP   ipratropium (ATROVENT) 0.03 % nasal spray Place 2 sprays into both nostrils every 12 (twelve) hours. 30 mL Keino Placencia-Warren, Sadie Haber, NP   cetirizine (ZYRTEC) 10 MG tablet Take 1 tablet (10 mg total) by mouth daily. 30 tablet Gildardo Tickner-Warren, Sadie Haber, NP      PDMP not reviewed this encounter.   Abran Cantor, NP 03/05/22 1442

## 2022-03-05 NOTE — ED Triage Notes (Signed)
Pt reports facial pressure, headache,nasal congestion since last night and reports "this happens a couple times of year."

## 2022-03-06 DIAGNOSIS — Z419 Encounter for procedure for purposes other than remedying health state, unspecified: Secondary | ICD-10-CM | POA: Diagnosis not present

## 2022-03-06 DIAGNOSIS — Z6826 Body mass index (BMI) 26.0-26.9, adult: Secondary | ICD-10-CM | POA: Diagnosis not present

## 2022-03-06 DIAGNOSIS — I10 Essential (primary) hypertension: Secondary | ICD-10-CM | POA: Diagnosis not present

## 2022-03-06 DIAGNOSIS — E663 Overweight: Secondary | ICD-10-CM | POA: Diagnosis not present

## 2022-03-06 DIAGNOSIS — J329 Chronic sinusitis, unspecified: Secondary | ICD-10-CM | POA: Diagnosis not present

## 2022-03-08 ENCOUNTER — Emergency Department (HOSPITAL_COMMUNITY)
Admission: EM | Admit: 2022-03-08 | Discharge: 2022-03-08 | Disposition: A | Discharge status: Home / Self Care | Payer: Medicaid Other | Attending: Emergency Medicine | Admitting: Emergency Medicine

## 2022-03-08 ENCOUNTER — Emergency Department (HOSPITAL_COMMUNITY): Payer: Medicaid Other

## 2022-03-08 ENCOUNTER — Encounter (HOSPITAL_COMMUNITY): Payer: Self-pay

## 2022-03-08 ENCOUNTER — Other Ambulatory Visit: Payer: Self-pay

## 2022-03-08 DIAGNOSIS — R519 Headache, unspecified: Secondary | ICD-10-CM | POA: Insufficient documentation

## 2022-03-08 DIAGNOSIS — Z79899 Other long term (current) drug therapy: Secondary | ICD-10-CM | POA: Diagnosis not present

## 2022-03-08 DIAGNOSIS — E039 Hypothyroidism, unspecified: Secondary | ICD-10-CM | POA: Insufficient documentation

## 2022-03-08 MED ORDER — KETOROLAC TROMETHAMINE 15 MG/ML IJ SOLN
15.0000 mg | Freq: Once | INTRAMUSCULAR | Status: AC
  Administered 2022-03-08: 15 mg via INTRAVENOUS
  Filled 2022-03-08: qty 1

## 2022-03-08 MED ORDER — SODIUM CHLORIDE 0.9 % IV BOLUS
500.0000 mL | Freq: Once | INTRAVENOUS | Status: AC
  Administered 2022-03-08: 500 mL via INTRAVENOUS

## 2022-03-08 MED ORDER — DIPHENHYDRAMINE HCL 50 MG/ML IJ SOLN
12.5000 mg | Freq: Once | INTRAMUSCULAR | Status: AC
  Administered 2022-03-08: 12.5 mg via INTRAVENOUS
  Filled 2022-03-08: qty 1

## 2022-03-08 MED ORDER — METOCLOPRAMIDE HCL 5 MG/ML IJ SOLN
10.0000 mg | Freq: Once | INTRAMUSCULAR | Status: AC
  Administered 2022-03-08: 10 mg via INTRAVENOUS
  Filled 2022-03-08: qty 2

## 2022-03-08 MED ORDER — ACETAMINOPHEN 500 MG PO TABS
1000.0000 mg | ORAL_TABLET | Freq: Once | ORAL | Status: AC
  Administered 2022-03-08: 1000 mg via ORAL
  Filled 2022-03-08: qty 2

## 2022-03-08 MED ORDER — HYDROCODONE-ACETAMINOPHEN 5-325 MG PO TABS: 1.0000 | ORAL_TABLET | Freq: Four times a day (QID) | ORAL | 0 refills | Status: DC | PRN

## 2022-03-08 NOTE — Discharge Instructions (Addendum)
I have prescribed you a strong narcotic called Vicodin. Please only take this as prescribed. This medication also has tylenol in it, so please be sure you are not taking more than 3000 mg of tylenol per day. Do not drive or operate heavy machinery after taking this medication. Do not mix it with alcohol.   I agree that you should probably follow-up with your dentist for reevaluation of your pain.  I would also recommend that you continue taking the prescribed antibiotic.  Please continue to monitor your symptoms closely.  If you develop any new or worsening symptoms please come back to the emergency department.

## 2022-03-08 NOTE — ED Provider Notes (Signed)
[facility] EMERGENCY DEPARTMENT Provider Note   CSN: [id number] Arrival date & time: 03/08/22  1855     History  Chief Complaint  Patient presents with   Headache    Kristin [name] is a 46 y.o. female.  HPI Patient is a 46 year old female with a history of hypothyroidism, GERD, who presents to the emergency department due to left-sided pain along the head.  States her symptoms started about 4 days ago.  Initially started in the left parietal region and states that her pain is gradually worsening and is now spreading down the whole left side of her head.  Denies any visual changes, numbness, weakness, cough, rhinorrhea, sore throat.  She does note some mild pain when biting down but no dental swelling or difficulty swallowing.    Home Medications Prior to Admission medications   Medication Sig Start Date End Date Taking? Authorizing Provider  HYDROcodone-acetaminophen (NORCO/VICODIN) 5-325 MG tablet Take 1 tablet by mouth every 6 (six) hours as needed. 03/08/22  Yes Placido Sou, PA-C  acetaminophen (TYLENOL) 500 MG tablet Take by mouth.    [provider]  ALPRAZolam Prudy Feeler) 0.5 MG tablet Take 0.5 mg by mouth 4 (four) times daily as needed. 03/06/21   [provider]  cetirizine (ZYRTEC) 10 MG tablet Take 1 tablet (10 mg total) by mouth daily. 03/05/22   Leath-Warren, Sadie Haber, NP  ciprofloxacin (CIPRO) 500 MG tablet Take 1 tablet (500 mg total) by mouth 2 (two) times daily. 07/15/21   Adline Potter, NP  ibuprofen (ADVIL) 800 MG tablet Take 1 tablet (800 mg total) by mouth every 8 (eight) hours as needed. 03/05/22   Leath-Warren, Sadie Haber, NP  ipratropium (ATROVENT) 0.03 % nasal spray Place 2 sprays into both nostrils every 12 (twelve) hours. 03/05/22   Leath-Warren, Sadie Haber, NP  levothyroxine (SYNTHROID, LEVOTHROID) 112 MCG tablet Take 100 mcg by mouth daily before breakfast.     [provider]  pantoprazole (PROTONIX) 40 MG tablet Take 1 tablet  (40 mg total) by mouth daily. 02/12/22   Gilda Crease, MD  phenazopyridine (PYRIDIUM) 200 MG tablet Take 1 tablet (200 mg total) by mouth 3 (three) times daily as needed for pain. 07/03/21   Adline Potter, NP  Vitamin D, Ergocalciferol, (DRISDOL) 1.25 MG (50000 UNIT) CAPS capsule Take 1 capsule by mouth once a week. 02/25/21   [provider]      Allergies    Sulfa antibiotics    Review of Systems   Review of Systems  All other systems reviewed and are negative. Ten systems reviewed and are negative for acute change, except as noted in the HPI.   Physical Exam Updated Vital Signs BP (!) 158/98   Pulse 88   Temp 98 F (36.7 C) (Oral)   Resp 18   Ht 5\' 3"  (1.6 m)   Wt 66.2 kg   LMP 02/07/2022   SpO2 98%   BMI 25.86 kg/m  Physical Exam Vitals and nursing note reviewed.  Constitutional:      General: She is not in acute distress.    Appearance: Normal appearance. She is well-developed. She is not ill-appearing, toxic-appearing or diaphoretic.  HENT:     Head: Normocephalic and atraumatic.     Comments: Uvula midline.  Normal-appearing dentition.  No visible or palpable fluctuance noted.  No tenderness noted with manipulation of the left lower molars in the region of the patient's pain.  No tenderness appreciated along the left temporal region.  Right Ear: External ear normal.     Left Ear: External ear normal.     Nose: Nose normal.     Mouth/Throat:     Mouth: Mucous membranes are moist.     Pharynx: Oropharynx is clear. No oropharyngeal exudate or posterior oropharyngeal erythema.  Eyes:     General: No scleral icterus.    Extraocular Movements: Extraocular movements intact.     Right eye: Normal extraocular motion and no nystagmus.     Left eye: Normal extraocular motion and no nystagmus.     Pupils: Pupils are equal, round, and reactive to light. Pupils are equal.     Right eye: Pupil is round and reactive.     Left eye: Pupil is round and  reactive.     Comments: EOMI. PERRL.  Cardiovascular:     Rate and Rhythm: Normal rate and regular rhythm.     Pulses: Normal pulses.     Heart sounds: Normal heart sounds. No murmur heard.   No friction rub. No gallop.  Pulmonary:     Effort: Pulmonary effort is normal. No respiratory distress.     Breath sounds: Normal breath sounds. No stridor. No wheezing, rhonchi or rales.  Abdominal:     General: Abdomen is flat. There is no distension.     Palpations: Abdomen is soft.  Musculoskeletal:        General: Normal range of motion.     Cervical back: Normal range of motion and neck supple. No tenderness.  Skin:    General: Skin is warm and dry.  Neurological:     General: No focal deficit present.     Mental Status: She is alert and oriented to person, place, and time.     GCS: GCS eye subscore is 4. GCS verbal subscore is 5. GCS motor subscore is 6.     Comments: Patient is oriented to person, place, and time. Patient phonates in clear, complete, and coherent sentences. Strength is 5/5 in all four extremities. Distal sensation intact in all four extremities.  Ambulatory with a steady gait.  Psychiatric:        Mood and Affect: Mood normal.        Behavior: Behavior normal.    ED Results / Procedures / Treatments   Labs (all labs ordered are listed, but only abnormal results are displayed) Labs Reviewed - No data to display  EKG None  Radiology CT Head Wo Contrast  Result Date: 03/08/2022 CLINICAL DATA:  Headache, chronic, new features or increased frequency EXAM: CT HEAD WITHOUT CONTRAST TECHNIQUE: Contiguous axial images were obtained from the base of the skull through the vertex without intravenous contrast. RADIATION DOSE REDUCTION: This exam was performed according to the departmental dose-optimization program which includes automated exposure control, adjustment of the mA and/or kV according to patient size and/or use of iterative reconstruction technique. COMPARISON:   None Available. FINDINGS: Brain: Portions of the anterior inferior right temporal lobe are inadvertently excluded from the field of view. No acute hemorrhage, ischemia, hydrocephalus, midline shift or mass/mass effect. No subdural or extra-axial collection. Vascular: No hyperdense vessel or unexpected calcification. Skull: No fracture or focal lesion. Sinuses/Orbits: No acute findings.  No mastoid effusion. Other: None. IMPRESSION: No acute intracranial abnormality or explanation for symptoms. Electronically Signed   By: Narda Rutherford M.D.   On: 03/08/2022 20:56    Procedures Procedures   Medications Ordered in ED Medications  sodium chloride 0.9 % bolus 500 mL (0 mLs Intravenous Stopped  03/08/22 2206)  metoCLOPramide (REGLAN) injection 10 mg (10 mg Intravenous Given 03/08/22 2110)  ketorolac (TORADOL) 15 MG/ML injection 15 mg (15 mg Intravenous Given 03/08/22 2110)  acetaminophen (TYLENOL) tablet 1,000 mg (1,000 mg Oral Given 03/08/22 2109)  diphenhydrAMINE (BENADRYL) injection 12.5 mg (12.5 mg Intravenous Given 03/08/22 2110)  diphenhydrAMINE (BENADRYL) injection 12.5 mg (12.5 mg Intravenous Given 03/08/22 2136)    ED Course/ Medical Decision Making/ A&P Clinical Course as of 03/09/22 1314  Sat Mar 08, 2022  2152 Patient reassessed.  Initially was having a feeling of "jitteriness" after being given Reglan.  Patient given additional Benadryl and her symptoms improved.  Reports a mild to moderate improvement in her pain.  CT scan appears reassuring. [LJ]    Clinical Course User Index [LJ] Placido SouJoldersma, Aimar Borghi, PA-C                           Medical Decision Making Amount and/or Complexity of Data Reviewed Radiology: ordered.  Risk OTC drugs. Prescription drug management.   Pt is a 46 y.o. female who presents to the emergency department with a left-sided headache.  Please see HPI above for additional information.  Imaging: CT scan shows no acute intracranial abnormality or explanation for  symptoms.  I, Placido SouLogan Irlanda Croghan, PA-C, personally reviewed and evaluated these images and lab results as part of my medical decision-making.  Unsure the source of the patient's symptoms.  Physical exam appears reassuring.  Neurological exam is benign.  No zoster-like rash noted.  Pupils equal, round, reactive to light.  Extraocular movements are intact.  Visual fields grossly intact.  Neurological exam appears benign.  Normal-appearing dentition with no visible or palpable abscess noted.  No tenderness with manipulation of the molars in the region of the patient's dental pain.  No tenderness noted in the temporal region.  Patient denying any visual changes.  Does not appear consistent with GCA.  ENT exam otherwise appears reassuring.  Does not appear consistent with otitis media/externa at this time.  Patient did have mild tenderness along the left maxillary sinus but CT scan shows no acute findings in the sinuses.  CT scan appears generally reassuring as well.  Patient's symptoms were treated with a migraine cocktail and she reports mild to moderate improvement.  Urged her to continue taking the prescribed Augmentin that she was given by her PCP.  She was given a very short course of Vicodin for breakthrough pain.  Recommended PCP follow-up.  She is also going to follow-up with her dental provider.  We discussed return precautions in length.  She appears stable for discharge at this time and she is agreeable.  She has a ride home.  Her questions were answered and she was amicable at the time of discharge.  Note: Portions of this report may have been transcribed using voice recognition software. Every effort was made to ensure accuracy; however, inadvertent computerized transcription errors may be present.   Final Clinical Impression(s) / ED Diagnoses Final diagnoses:  Bad headache   Rx / DC Orders ED Discharge Orders          Ordered    HYDROcodone-acetaminophen (NORCO/VICODIN) 5-325 MG tablet   Every 6 hours PRN        03/08/22 2151              Placido SouJoldersma, Rhyder Koegel, PA-C 03/09/22 1316    Eber HongMiller, Brian, MD 03/10/22 1224

## 2022-03-08 NOTE — ED Triage Notes (Signed)
Reports L sided head and jaw pain that started on Tuesday.  Reports that she is taking abt for sinus infection since wed.  Reports continued pain.  Resp even and unlabored.  Skin warm and dry.  nad

## 2022-03-08 NOTE — ED Notes (Signed)
Patient verbalizes understanding of discharge instructions. Opportunity for questioning and answers were provided. Armband removed by staff, pt discharged from ED. Ambulated out to lobby with family ? ?

## 2022-04-05 DIAGNOSIS — Z419 Encounter for procedure for purposes other than remedying health state, unspecified: Secondary | ICD-10-CM | POA: Diagnosis not present

## 2022-04-10 DIAGNOSIS — H5789 Other specified disorders of eye and adnexa: Secondary | ICD-10-CM | POA: Diagnosis not present

## 2022-04-10 DIAGNOSIS — H02534 Eyelid retraction left upper eyelid: Secondary | ICD-10-CM | POA: Diagnosis not present

## 2022-04-10 DIAGNOSIS — E079 Disorder of thyroid, unspecified: Secondary | ICD-10-CM | POA: Diagnosis not present

## 2022-04-22 ENCOUNTER — Emergency Department (HOSPITAL_COMMUNITY): Payer: Medicaid Other

## 2022-04-22 ENCOUNTER — Encounter (HOSPITAL_COMMUNITY): Payer: Self-pay | Admitting: Emergency Medicine

## 2022-04-22 ENCOUNTER — Other Ambulatory Visit: Payer: Self-pay

## 2022-04-22 ENCOUNTER — Emergency Department (HOSPITAL_COMMUNITY)
Admission: EM | Admit: 2022-04-22 | Discharge: 2022-04-22 | Disposition: A | Discharge status: Home / Self Care | Payer: Medicaid Other | Attending: Emergency Medicine | Admitting: Emergency Medicine

## 2022-04-22 DIAGNOSIS — R079 Chest pain, unspecified: Secondary | ICD-10-CM | POA: Diagnosis not present

## 2022-04-22 DIAGNOSIS — R0789 Other chest pain: Secondary | ICD-10-CM | POA: Diagnosis not present

## 2022-04-22 DIAGNOSIS — R109 Unspecified abdominal pain: Secondary | ICD-10-CM | POA: Diagnosis not present

## 2022-04-22 DIAGNOSIS — K760 Fatty (change of) liver, not elsewhere classified: Secondary | ICD-10-CM | POA: Diagnosis not present

## 2022-04-22 LAB — CBC WITH DIFFERENTIAL/PLATELET
Abs Immature Granulocytes: 0.01 10*3/uL (ref 0.00–0.07)
Basophils Absolute: 0.1 10*3/uL (ref 0.0–0.1)
Basophils Relative: 1 %
Eosinophils Absolute: 0.2 10*3/uL (ref 0.0–0.5)
Eosinophils Relative: 4 %
HCT: 41.4 % (ref 36.0–46.0)
Hemoglobin: 13.6 g/dL (ref 12.0–15.0)
Immature Granulocytes: 0 %
Lymphocytes Relative: 32 %
Lymphs Abs: 1.7 10*3/uL (ref 0.7–4.0)
MCH: 30 pg (ref 26.0–34.0)
MCHC: 32.9 g/dL (ref 30.0–36.0)
MCV: 91.4 fL (ref 80.0–100.0)
Monocytes Absolute: 0.6 10*3/uL (ref 0.1–1.0)
Monocytes Relative: 12 %
Neutro Abs: 2.8 10*3/uL (ref 1.7–7.7)
Neutrophils Relative %: 51 %
Platelets: 183 10*3/uL (ref 150–400)
RBC: 4.53 MIL/uL (ref 3.87–5.11)
RDW: 14.6 % (ref 11.5–15.5)
WBC: 5.4 10*3/uL (ref 4.0–10.5)
nRBC: 0 % (ref 0.0–0.2)

## 2022-04-22 LAB — TROPONIN I (HIGH SENSITIVITY)
Troponin I (High Sensitivity): 3 ng/L (ref <18)
Troponin I (High Sensitivity): 3 ng/L (ref <18)

## 2022-04-22 LAB — COMPREHENSIVE METABOLIC PANEL
ALT: 54 U/L — ABNORMAL HIGH (ref 0–44)
AST: 45 U/L — ABNORMAL HIGH (ref 15–41)
Albumin: 3.9 g/dL (ref 3.5–5.0)
Alkaline Phosphatase: 106 U/L (ref 38–126)
Anion gap: 5 (ref 5–15)
BUN: 15 mg/dL (ref 6–20)
CO2: 25 mmol/L (ref 22–32)
Calcium: 8.7 mg/dL — ABNORMAL LOW (ref 8.9–10.3)
Chloride: 110 mmol/L (ref 98–111)
Creatinine, Ser: 0.7 mg/dL (ref 0.44–1.00)
GFR, Estimated: >60 mL/min (ref >60)
Glucose, Bld: 102 mg/dL — ABNORMAL HIGH (ref 70–99)
Potassium: 3.8 mmol/L (ref 3.5–5.1)
Sodium: 140 mmol/L (ref 135–145)
Total Bilirubin: 0.5 mg/dL (ref 0.3–1.2)
Total Protein: 6.4 g/dL — ABNORMAL LOW (ref 6.5–8.1)

## 2022-04-22 LAB — LIPASE, BLOOD: Lipase: 29 U/L (ref 11–51)

## 2022-04-22 MED ORDER — ALUM & MAG HYDROXIDE-SIMETH 200-200-20 MG/5ML PO SUSP
30.0000 mL | Freq: Once | ORAL | Status: AC
Start: 2022-04-22 — End: 2022-04-22
  Administered 2022-04-22: 30 mL via ORAL
  Filled 2022-04-22: qty 30

## 2022-04-22 MED ORDER — FAMOTIDINE IN NACL 20-0.9 MG/50ML-% IV SOLN
20.0000 mg | Freq: Once | INTRAVENOUS | Status: AC
  Administered 2022-04-22: 20 mg via INTRAVENOUS
  Filled 2022-04-22: qty 50

## 2022-04-22 NOTE — ED Provider Notes (Signed)
[facility] EMERGENCY DEPARTMENT Provider Note   CSN: [id number] Arrival date & time: 04/22/22  0410     History  Chief Complaint  Patient presents with   Chest Pain    Kristin [name] is a 46 y.o. female.  Patient presents to the emergency department for evaluation of chest pain.  Patient reports being awakened from sleep by left-sided chest pain today.  Pain does wrap around into her back.  She does not feel any significant shortness of breath with the symptoms.  Patient reports that it does feel like heartburn that she has had in the past, however pain does not normally went to her back.       Home Medications Prior to Admission medications   Medication Sig Start Date End Date Taking? Authorizing Provider  acetaminophen (TYLENOL) 500 MG tablet Take by mouth.    [provider]  ALPRAZolam Prudy Feeler) 0.5 MG tablet Take 0.5 mg by mouth 4 (four) times daily as needed. 03/06/21   [provider]  cetirizine (ZYRTEC) 10 MG tablet Take 1 tablet (10 mg total) by mouth daily. 03/05/22   Leath-Warren, Sadie Haber, NP  ciprofloxacin (CIPRO) 500 MG tablet Take 1 tablet (500 mg total) by mouth 2 (two) times daily. 07/15/21   Adline Potter, NP  HYDROcodone-acetaminophen (NORCO/VICODIN) 5-325 MG tablet Take 1 tablet by mouth every 6 (six) hours as needed. 03/08/22   Placido Sou, PA-C  ibuprofen (ADVIL) 800 MG tablet Take 1 tablet (800 mg total) by mouth every 8 (eight) hours as needed. 03/05/22   Leath-Warren, Sadie Haber, NP  ipratropium (ATROVENT) 0.03 % nasal spray Place 2 sprays into both nostrils every 12 (twelve) hours. 03/05/22   Leath-Warren, Sadie Haber, NP  levothyroxine (SYNTHROID, LEVOTHROID) 112 MCG tablet Take 100 mcg by mouth daily before breakfast.     [provider]  pantoprazole (PROTONIX) 40 MG tablet Take 1 tablet (40 mg total) by mouth daily. 02/12/22   Gilda Crease, MD  phenazopyridine (PYRIDIUM) 200 MG tablet Take 1 tablet (200 mg total)  by mouth 3 (three) times daily as needed for pain. 07/03/21   Adline Potter, NP  Vitamin D, Ergocalciferol, (DRISDOL) 1.25 MG (50000 UNIT) CAPS capsule Take 1 capsule by mouth once a week. 02/25/21   [provider]      Allergies    Sulfa antibiotics    Review of Systems   Review of Systems  Physical Exam Updated Vital Signs BP 101/72   Pulse (!) 52   Temp (!) 97.5 F (36.4 C) (Oral)   Resp 16   Ht 5\' 3"  (1.6 m)   Wt 64.4 kg   LMP 04/19/2022   SpO2 96%   BMI 25.15 kg/m  Physical Exam Vitals and nursing note reviewed.  Constitutional:      General: She is not in acute distress.    Appearance: She is well-developed.  HENT:     Head: Normocephalic and atraumatic.     Mouth/Throat:     Mouth: Mucous membranes are moist.  Eyes:     General: Vision grossly intact. Gaze aligned appropriately.     Extraocular Movements: Extraocular movements intact.     Conjunctiva/sclera: Conjunctivae normal.  Cardiovascular:     Rate and Rhythm: Normal rate and regular rhythm.     Pulses: Normal pulses.     Heart sounds: Normal heart sounds, S1 normal and S2 normal. No murmur heard.    No friction rub. No gallop.  Pulmonary:  Effort: Pulmonary effort is normal. No respiratory distress.     Breath sounds: Normal breath sounds.  Abdominal:     General: Bowel sounds are normal.     Palpations: Abdomen is soft.     Tenderness: There is no abdominal tenderness. There is no guarding or rebound.     Hernia: No hernia is present.  Musculoskeletal:        General: No swelling.     Cervical back: Full passive range of motion without pain, normal range of motion and neck supple. No spinous process tenderness or muscular tenderness. Normal range of motion.     Right lower leg: No edema.     Left lower leg: No edema.  Skin:    General: Skin is warm and dry.     Capillary Refill: Capillary refill takes less than 2 seconds.     Findings: No ecchymosis, erythema, rash or wound.   Neurological:     General: No focal deficit present.     Mental Status: She is alert and oriented to person, place, and time.     GCS: GCS eye subscore is 4. GCS verbal subscore is 5. GCS motor subscore is 6.     Cranial Nerves: Cranial nerves 2-12 are intact.     Sensory: Sensation is intact.     Motor: Motor function is intact.     Coordination: Coordination is intact.  Psychiatric:        Attention and Perception: Attention normal.        Mood and Affect: Mood normal.        Speech: Speech normal.        Behavior: Behavior normal.     ED Results / Procedures / Treatments   Labs (all labs ordered are listed, but only abnormal results are displayed) Labs Reviewed  COMPREHENSIVE METABOLIC PANEL - Abnormal; Notable for the following components:      Result Value   Glucose, Bld 102 (*)    Calcium 8.7 (*)    Total Protein 6.4 (*)    AST 45 (*)    ALT 54 (*)    All other components within normal limits  CBC WITH DIFFERENTIAL/PLATELET  LIPASE, BLOOD  TROPONIN I (HIGH SENSITIVITY)  TROPONIN I (HIGH SENSITIVITY)    EKG EKG Interpretation  Date/Time:  Tuesday April 22 2022 04:20:11 EDT Ventricular Rate:  72 PR Interval:  136 QRS Duration: 102 QT Interval:  427 QTC Calculation: 468 R Axis:   80 Text Interpretation: Sinus rhythm RSR' in V1 or V2, right VCD or RVH Confirmed by Gilda Crease [id number]) on 04/22/2022 4:21:51 AM  Radiology DG Chest Port 1 View  Result Date: 04/22/2022 CLINICAL DATA:  Chest pain EXAM: PORTABLE CHEST 1 VIEW COMPARISON:  02/11/2022 FINDINGS: 0500 hours. The lungs are clear without focal pneumonia, edema, pneumothorax or pleural effusion. Linear atelectasis or scarring noted left base. The cardiopericardial silhouette is within normal limits for size. The visualized bony structures of the thorax are unremarkable. Telemetry leads overlie the chest. IMPRESSION: No acute cardiopulmonary findings. Electronically Signed   By: Kennith Center M.D.   On:  04/22/2022 05:27    Procedures Procedures    Medications Ordered in ED Medications  famotidine (PEPCID) IVPB 20 mg premix (0 mg Intravenous Stopped 04/22/22 0647)  alum & mag hydroxide-simeth (MAALOX/MYLANTA) 200-200-20 MG/5ML suspension 30 mL (30 mLs Oral Given 04/22/22 7824)    ED Course/ Medical Decision Making/ A&P  Medical Decision Making Amount and/or Complexity of Data Reviewed Labs: ordered. Radiology: ordered.  Risk OTC drugs. Prescription drug management.   Presents to the emergency department for evaluation of chest pain.  Patient awakened with pain.  Patient has had similar episodes in the past has been related to her GERD.  Patient currently taking Protonix daily.  She took Pepcid when the symptoms began but did not have improvement.  Differential diagnosis considered includes unstable angina, NSTEMI, gastritis, peptic ulcer disease, cholecystitis, GERD.  EKG does not suggest ischemia or infarct.  Troponin negative.  Patient has had resolution of symptoms after GI cocktail and Pepcid.  Suspect symptoms are GI in nature.  We will perform ultrasound of gallbladder to rule out cholelithiasis.  Patient has been in the ED with similar chest pain a couple of times recently.  Refer to cardiology for definitive cardiac rule out.  If negative, GI follow-up would be reasonable.        Final Clinical Impression(s) / ED Diagnoses Final diagnoses:  Atypical chest pain    Rx / DC Orders ED Discharge Orders          Ordered    Ambulatory referral to Cardiology       Comments: If you have not heard from the Cardiology office within the next 72 hours please call [phone].   04/22/22 5916              Gilda Crease, MD 04/22/22 [phone]

## 2022-04-22 NOTE — Discharge Instructions (Signed)
Increase your Protonix so you are taking it twice a day.  You have been referred to both cardiology and gastroenterology for follow-up

## 2022-04-22 NOTE — ED Notes (Signed)
Xr at bedside

## 2022-04-22 NOTE — ED Notes (Signed)
Pt alert, NAD, calm, interactive, resps e/u, speaking in complete clear sentences. Denies CP or SOB. Mentions some minimal intermittent lightheadedness/ dizziness.

## 2022-04-22 NOTE — ED Triage Notes (Signed)
Pt c/o chest pain that woke her up this am.

## 2022-05-06 DIAGNOSIS — Z419 Encounter for procedure for purposes other than remedying health state, unspecified: Secondary | ICD-10-CM | POA: Diagnosis not present

## 2022-05-08 ENCOUNTER — Other Ambulatory Visit: Payer: Self-pay | Admitting: Internal Medicine

## 2022-05-08 ENCOUNTER — Ambulatory Visit (HOSPITAL_COMMUNITY)
Admission: RE | Admit: 2022-05-08 | Discharge: 2022-05-08 | Disposition: A | Discharge status: Home / Self Care | Payer: Medicaid Other | Source: Ambulatory Visit | Attending: Internal Medicine | Admitting: Internal Medicine

## 2022-05-08 ENCOUNTER — Other Ambulatory Visit (HOSPITAL_COMMUNITY): Payer: Self-pay | Admitting: Internal Medicine

## 2022-05-08 DIAGNOSIS — G629 Polyneuropathy, unspecified: Secondary | ICD-10-CM | POA: Diagnosis not present

## 2022-05-08 DIAGNOSIS — I10 Essential (primary) hypertension: Secondary | ICD-10-CM | POA: Diagnosis not present

## 2022-05-08 DIAGNOSIS — R102 Pelvic and perineal pain: Secondary | ICD-10-CM | POA: Insufficient documentation

## 2022-05-08 DIAGNOSIS — Z6825 Body mass index (BMI) 25.0-25.9, adult: Secondary | ICD-10-CM | POA: Diagnosis not present

## 2022-05-08 DIAGNOSIS — E663 Overweight: Secondary | ICD-10-CM | POA: Diagnosis not present

## 2022-05-08 DIAGNOSIS — R1031 Right lower quadrant pain: Secondary | ICD-10-CM | POA: Diagnosis not present

## 2022-05-09 NOTE — Progress Notes (Signed)
Cardiology Office Note:   Date:  05/12/2022  NAME:  Kristin [name]    MRN: [id number] DOB:  04/02/1976   PCP:  Elfredia Nevins, MD  Cardiologist:  None  Electrophysiologist:  None   Referring MD: Gilda Crease,*   Chief Complaint  Patient presents with   Chest Pain   History of Present Illness:   Kristin [name] is a 46 y.o. female with a hx of anxiety who is being seen today for the evaluation of chest pain at the request of Elfredia Nevins, MD. seen in the emergency room on 04/23/2022 for chest pain.  Troponins negative.  EKG with normal sinus rhythm and no acute ischemic changes.  She reports over the past 2 months she has had episode of burning in her chest.  She reports sharp discomfort.  Symptoms have lasted hours.  She has had similar episodes in the past which have been attributed to acid reflux.  She is recently started back on Protonix.  She was seen in the emergency room on 04/23/2022 with a similar sensation.  She reports hours of burning in her chest.  She was given a GI cocktail with improvement in her symptoms.  She reports no further episodes.  Her medical history is significant for high blood pressure which is well-controlled.  She is a smoker of 20+ years.  She has continued to smoke.  She works out at [employer]  She can go for several hours on Monday Wednesdays and Fridays.  She reports no chest pain.  She does get short of breath but reports this is expected.  She does have a family history of heart disease.  She has personally never had a heart attack or stroke.  Her EKG was reviewed in the ER which showed normal sinus rhythm with no acute ischemic changes or evidence of infarction.  She has had no further episodes of chest discomfort.  Her CV exam is normal.  She reports no alcohol or drug use.  She works as a Water engineer.  She reports she has 2 children.   Past Medical History: Past Medical History:  Diagnosis Date   Anemia    Anxiety    Essential hypertension     GERD (gastroesophageal reflux disease)    Graves disease    History of UTI    Hyperthyroidism    Supervision of normal intrauterine pregnancy in multigravida 10/08/2011   Thyrotoxicosis     Past Surgical History: Past Surgical History:  Procedure Laterality Date   ECTOPIC PREGNANCY SURGERY     EYE SURGERY  09/09/2017   orbital decompression    TUBAL LIGATION  10/08/2011   Procedure: POST PARTUM TUBAL LIGATION;  Surgeon: Catalina Antigua, MD;  Location: [facility];  Service: Gynecology;  Laterality: Bilateral;   WISDOM TOOTH EXTRACTION      Current Medications: Current Meds  Medication Sig   acetaminophen (TYLENOL) 500 MG tablet Take by mouth.   ALPRAZolam (XANAX) 0.5 MG tablet Take 0.5 mg by mouth 4 (four) times daily as needed.   erythromycin ophthalmic ointment SMARTSIG:sparingly Left Eye Every Night   ibuprofen (ADVIL) 800 MG tablet Take 1 tablet (800 mg total) by mouth every 8 (eight) hours as needed.   levothyroxine (SYNTHROID, LEVOTHROID) 112 MCG tablet Take 100 mcg by mouth daily before breakfast.    losartan (COZAAR) 50 MG tablet Take by mouth.   pantoprazole (PROTONIX) 40 MG tablet Take 1 tablet (40 mg total) by mouth daily.   [DISCONTINUED] cetirizine (  ZYRTEC) 10 MG tablet Take 1 tablet (10 mg total) by mouth daily.   [DISCONTINUED] ciprofloxacin (CIPRO) 500 MG tablet Take 1 tablet (500 mg total) by mouth 2 (two) times daily.   [DISCONTINUED] HYDROcodone-acetaminophen (NORCO/VICODIN) 5-325 MG tablet Take 1 tablet by mouth every 6 (six) hours as needed.   [DISCONTINUED] ipratropium (ATROVENT) 0.03 % nasal spray Place 2 sprays into both nostrils every 12 (twelve) hours.   [DISCONTINUED] phenazopyridine (PYRIDIUM) 200 MG tablet Take 1 tablet (200 mg total) by mouth 3 (three) times daily as needed for pain.   [DISCONTINUED] Vitamin D, Ergocalciferol, (DRISDOL) 1.25 MG (50000 UNIT) CAPS capsule Take 1 capsule by mouth once a week.     Allergies:    Sulfa antibiotics   Social  History: Social History   Socioeconomic History   Marital status: Significant Other    Spouse name: Not on file   Number of children: 2   Years of education: Not on file   Highest education level: Not on file  Occupational History   Occupation: Home Health Aid  Tobacco Use   Smoking status: Every Day    Packs/day: 1.00    Years: 20.00    Total pack years: 20.00    Types: Cigarettes    Last attempt to quit: 04/22/2018    Years since quitting: 4.0   Smokeless tobacco: Never  Vaping Use   Vaping Use: Never used  Substance and Sexual Activity   Alcohol use: Yes    Comment: occ   Drug use: No   Sexual activity: Yes    Birth control/protection: Surgical    Comment: tubal  Other Topics Concern   Not on file  Social History Narrative   Married   2 children: Age 11 y/o and 62 mos old (as of 57 OV)       Social Determinants of Corporate investment banker Strain: Not on Ship broker Insecurity: Not on file  Transportation Needs: Not on file  Physical Activity: Not on file  Stress: Not on file  Social Connections: Not on file     Family History: The patient's family history includes Diabetes in her father; Heart disease in her maternal grandmother; Heart disease (age of onset: 79) in her father; High Cholesterol in her mother; Hypertension in her mother; Liver disease in her sister; Lupus in her paternal grandmother.  ROS:   All other ROS reviewed and negative. Pertinent positives noted in the HPI.     EKGs/Labs/Other Studies Reviewed:   The following studies were personally reviewed by me today:  EKG:  EKG reviewed from the emergency room dated 04/22/2022.  That EKG demonstrates normal sinus rhythm with no acute ischemic changes or evidence of infarction.  Recent Labs: 04/22/2022: ALT 54; BUN 15; Creatinine, Ser 0.70; Hemoglobin 13.6; Platelets 183; Potassium 3.8; Sodium 140   Recent Lipid Panel    Component Value Date/Time   CHOL 150 12/13/2013 0956   TRIG 68  12/13/2013 0956   HDL 43 12/13/2013 0956   CHOLHDL 3.5 12/13/2013 0956   VLDL 14 12/13/2013 0956   LDLCALC 93 12/13/2013 0956    Physical Exam:   VS:  BP 124/80   Pulse 82   Ht 5\' 3"  (1.6 m)   Wt 147 lb 3.2 oz (66.8 kg)   LMP 04/19/2022   SpO2 99%   BMI 26.08 kg/m    Wt Readings from Last 3 Encounters:  05/12/22 147 lb 3.2 oz (66.8 kg)  04/22/22 142 lb (64.4 kg)  03/08/22 146 lb (66.2 kg)    General: Well nourished, well developed, in no acute distress Head: Atraumatic, normal size  Eyes: PEERLA, EOMI  Neck: Supple, no JVD Endocrine: No thryomegaly Cardiac: Normal S1, S2; RRR; no murmurs, rubs, or gallops Lungs: Clear to auscultation bilaterally, no wheezing, rhonchi or rales  Abd: Soft, nontender, no hepatomegaly  Ext: No edema, pulses 2+ Musculoskeletal: No deformities, BUE and BLE strength normal and equal Skin: Warm and dry, no rashes   Neuro: Alert and oriented to person, place, time, and situation, CNII-XII grossly intact, no focal deficits  Psych: Normal mood and affect   ASSESSMENT:   Kristin [name] is a 46 y.o. female who presents for the following: 1. Gastroesophageal reflux disease without esophagitis   2. Precordial pain   3. Primary hypertension   4. Tobacco abuse     PLAN:   1. Gastroesophageal reflux disease without esophagitis 2. Precordial pain -Seen in the emergency room with sharp burning chest discomfort that improved with a GI cocktail.  Her EKG is normal.  Troponins were negative.  She can go to the gym and exercise for several hours without any chest discomfort.  To me this is all acid reflux.  This does not need further workup.  I have recommended a heart healthy diet as well as to refrain from tobacco use.  This will help prevent any future heart disease episodes.  Given her current symptoms that are clearly acid reflux related she does not require further testing.  She will see [place] back as needed.  3. Primary hypertension -Well-controlled on  current medication.  4. Tobacco abuse -Smoking cessation was advised.  Disposition: No follow-ups on file.  Medication Adjustments/Labs and Tests Ordered: Current medicines are reviewed at length with the patient today.  Concerns regarding medicines are outlined above.  No orders of the defined types were placed in this encounter.  No orders of the defined types were placed in this encounter.   Patient Instructions  Medication Instructions:  The current medical regimen is effective;  continue present plan and medications.  *If you need a refill on your cardiac medications before your next appointment, please call your pharmacy*   Follow-Up: At [facility], you and your health needs are our priority.  As part of our continuing mission to provide you with exceptional heart care, we have created designated Provider Care Teams.  These Care Teams include your primary Cardiologist (physician) and Advanced Practice Providers (APPs -  Physician Assistants and Nurse Practitioners) who all work together to provide you with the care you need, when you need it.  We recommend signing up for the patient portal called "MyChart".  Sign up information is provided on this After Visit Summary.  MyChart is used to connect with patients for Virtual Visits (Telemedicine).  Patients are able to view lab/test results, encounter notes, upcoming appointments, etc.  Non-urgent messages can be sent to your provider as well.   To learn more about what you can do with MyChart, go to [email or web address].    Your next appointment:   As needed  The format for your next appointment:   In Person  Provider:   Lennie Odor, MD          Signed, Lenna Gilford. Flora Lipps, MD, [facility]  [facility]  [address] [place], [place] [address] [phone]  05/12/2022 9:13 AM

## 2022-05-12 ENCOUNTER — Ambulatory Visit (INDEPENDENT_AMBULATORY_CARE_PROVIDER_SITE_OTHER): Payer: Medicaid Other | Admitting: Cardiovascular Disease

## 2022-05-12 ENCOUNTER — Encounter: Payer: Self-pay | Admitting: Cardiovascular Disease

## 2022-05-12 VITALS — BP 124/80 | HR 82 | Ht 63.0 in | Wt 147.2 lb

## 2022-05-12 DIAGNOSIS — I10 Essential (primary) hypertension: Secondary | ICD-10-CM

## 2022-05-12 DIAGNOSIS — K219 Gastro-esophageal reflux disease without esophagitis: Secondary | ICD-10-CM

## 2022-05-12 DIAGNOSIS — R072 Precordial pain: Secondary | ICD-10-CM

## 2022-05-12 DIAGNOSIS — Z720 Tobacco use: Secondary | ICD-10-CM

## 2022-05-12 NOTE — Patient Instructions (Signed)
Medication Instructions:  The current medical regimen is effective;  continue present plan and medications.  *If you need a refill on your cardiac medications before your next appointment, please call your pharmacy*    Follow-Up: At [facility], you and your health needs are our priority.  As part of our continuing mission to provide you with exceptional heart care, we have created designated Provider Care Teams.  These Care Teams include your primary Cardiologist (physician) and Advanced Practice Providers (APPs -  Physician Assistants and Nurse Practitioners) who all work together to provide you with the care you need, when you need it.  We recommend signing up for the patient portal called "MyChart".  Sign up information is provided on this After Visit Summary.  MyChart is used to connect with patients for Virtual Visits (Telemedicine).  Patients are able to view lab/test results, encounter notes, upcoming appointments, etc.  Non-urgent messages can be sent to your provider as well.   To learn more about what you can do with MyChart, go to [email or web address].    Your next appointment:   As needed  The format for your next appointment:   In Person  Provider:   Iroquois O'Neal, MD      

## 2022-05-16 ENCOUNTER — Encounter: Payer: Self-pay | Admitting: Adult Health

## 2022-05-16 ENCOUNTER — Ambulatory Visit (INDEPENDENT_AMBULATORY_CARE_PROVIDER_SITE_OTHER): Payer: Medicaid Other | Admitting: Adult Health

## 2022-05-16 ENCOUNTER — Other Ambulatory Visit (HOSPITAL_COMMUNITY)
Admission: RE | Admit: 2022-05-16 | Discharge: 2022-05-16 | Disposition: A | Discharge status: Home / Self Care | Payer: Medicaid Other | Source: Ambulatory Visit | Attending: Adult Health | Admitting: Adult Health

## 2022-05-16 VITALS — BP 110/73 | HR 67 | Ht 64.0 in | Wt 146.0 lb

## 2022-05-16 DIAGNOSIS — N926 Irregular menstruation, unspecified: Secondary | ICD-10-CM

## 2022-05-16 DIAGNOSIS — R1031 Right lower quadrant pain: Secondary | ICD-10-CM

## 2022-05-16 DIAGNOSIS — Z124 Encounter for screening for malignant neoplasm of cervix: Secondary | ICD-10-CM | POA: Diagnosis not present

## 2022-05-16 NOTE — Progress Notes (Signed)
  Subjective:     Patient ID: Kristin [name], female   DOB: Nov 26, 1975, 46 y.o.   MRN: [id number]  HPI Kristin [name] is a 46 year old white female with SO, [id number], in after having RLQ pain and having [place] at [facility]. She has seen PCP.  Lab Results  Component Value Date   DIAGPAP  07/11/2019    - Negative for intraepithelial lesion or malignancy (NILM)   HPVHIGH Negative 07/11/2019   PCP is Dr Sherwood Gambler.  Review of Systems Had RLQ pain about 2 weeks ago, hurt with movement. She had been working out  The pain in much better now    -periods irregular, may be spotty or heavy Reviewed past medical,surgical, social and family history. Reviewed medications and allergies.  Objective:   Physical Exam BP 110/73 (BP Location: Right Arm, Patient Position: Sitting, Cuff Size: Normal)   Pulse 67   Ht 5\' 4"  (1.626 m)   Wt 146 lb (66.2 kg)   LMP 04/19/2022   BMI 25.06 kg/m     Skin warm and dry.Pelvic: external genitalia is normal in appearance no lesions, vagina: +spotting blood,urethra has no lesions or masses noted, cervix:smooth and bulbous,pap with HR HPV genotyping performed, uterus: normal size, shape and contour, non tender, no masses felt, adnexa: no masses or tenderness noted. Bladder is non tender and no masses felt. No hernia felt when standing and asked to cough.  04/21/2022 reviewed: FINDINGS: Uterus   Measurements: 7.6 x 3.6 x 5.2 cm = volume: 75 mL. No fibroids or other mass visualized.   Endometrium   Thickness: 0.8 cm.  No focal abnormality visualized.   Right ovary   Not visualized.  Left ovary   Not visualized.   Other findings   Small volume free fluid in the low pelvis. IMPRESSION: 1. Nonvisualization of the bilateral ovaries. 2. Small volume nonspecific free fluid in the low pelvis. 3. Consider CT to further evaluate otherwise unexplained pain.    Upstream - 05/16/22 0914       Pregnancy Intention Screening   Does the patient want to become pregnant in the next year? No     Does the patient's partner want to become pregnant in the next year? No    Would the patient like to discuss contraceptive options today? No      Contraception Wrap Up   Current Method Female Sterilization    End Method Female Sterilization    Contraception Counseling Provided No            Examination chaperoned by 07/16/22 LPN  Assessment:     1. RLQ abdominal pain Pain is better she says, none on exam today Will reassess in 2 weeks  Could have had cyst rupture, could have been muscle related too I don;t think CT needed at this time due to pain resolving  2. Irregular periods Follow for now   3. Routine Papanicolaou smear Pap sent Pap in 3 years if normal     Plan:     Return 06/02/22 for recheck and ROS and check labs, (had elevated LFTs in July, but had been taking tylenol after dental procedure).

## 2022-05-20 LAB — CYTOLOGY - PAP
Comment: NEGATIVE
Diagnosis: NEGATIVE
High risk HPV: NEGATIVE

## 2022-06-02 ENCOUNTER — Encounter: Payer: Self-pay | Admitting: Adult Health

## 2022-06-02 ENCOUNTER — Ambulatory Visit (INDEPENDENT_AMBULATORY_CARE_PROVIDER_SITE_OTHER): Payer: Medicaid Other | Admitting: Adult Health

## 2022-06-02 VITALS — BP 131/78 | HR 70 | Ht 63.0 in | Wt 148.0 lb

## 2022-06-02 DIAGNOSIS — R748 Abnormal levels of other serum enzymes: Secondary | ICD-10-CM | POA: Diagnosis not present

## 2022-06-02 DIAGNOSIS — R1031 Right lower quadrant pain: Secondary | ICD-10-CM

## 2022-06-02 DIAGNOSIS — N926 Irregular menstruation, unspecified: Secondary | ICD-10-CM

## 2022-06-02 DIAGNOSIS — N951 Menopausal and female climacteric states: Secondary | ICD-10-CM

## 2022-06-02 NOTE — Progress Notes (Signed)
  Subjective:     Patient ID: Kristin [name], female   DOB: 1976/07/19, 46 y.o.   MRN: [id number]  HPI [name] is a 46 year old white female, with SO, [id number] back in follow up on RLQ pain, which has resolved and to check LFTs, that were elevated in July.   Pap was 05/16/22  High risk HPV Negative   Adequacy Satisfactory for evaluation; transformation zone component PRESENT.   Diagnosis - Negative for intraepithelial lesion or malignancy (NILM)   Comment Normal Reference Range HPV - Negative   PCP is DR Sherwood Gambler.  Review of Systems Pain in RLQ area has resolved Periods irregular  Reviewed past medical,surgical, social and family history. Reviewed medications and allergies.     Objective:   Physical Exam BP 131/78 (BP Location: Left Arm, Patient Position: Sitting, Cuff Size: Normal)   Pulse 70   Ht 5\' 3"  (1.6 m)   Wt 148 lb (67.1 kg)   LMP 05/19/2022 (Approximate)   BMI 26.22 kg/m     Skin warm and dry.  Lungs: clear to ausculation bilaterally. Cardiovascular: regular rate and rhythm.   Upstream - 06/02/22 1030       Pregnancy Intention Screening   Does the patient want to become pregnant in the next year? No    Does the patient's partner want to become pregnant in the next year? No    Would the patient like to discuss contraceptive options today? No      Contraception Wrap Up   Current Method Female Sterilization    End Method Female Sterilization             Assessment:     1. RLQ abdominal pain Has resolved  2. Elevated liver enzymes Will recheck LFTs was elevated in July, but was taking a lot of tylenol then  - Comprehensive metabolic panel  3. Irregular periods   4. Peri-menopause     Plan:     Follow up prn

## 2022-06-03 ENCOUNTER — Telehealth: Payer: Self-pay | Admitting: Adult Health

## 2022-06-03 LAB — COMPREHENSIVE METABOLIC PANEL
ALT: 18 IU/L (ref 0–32)
AST: 24 IU/L (ref 0–40)
Albumin/Globulin Ratio: 2.3 — ABNORMAL HIGH (ref 1.2–2.2)
Albumin: 4.4 g/dL (ref 3.9–4.9)
Alkaline Phosphatase: 108 IU/L (ref 44–121)
BUN/Creatinine Ratio: 17 (ref 9–23)
BUN: 12 mg/dL (ref 6–24)
Bilirubin Total: 0.4 mg/dL (ref 0.0–1.2)
CO2: 21 mmol/L (ref 20–29)
Calcium: 9.3 mg/dL (ref 8.7–10.2)
Chloride: 107 mmol/L — ABNORMAL HIGH (ref 96–106)
Creatinine, Ser: 0.72 mg/dL (ref 0.57–1.00)
Globulin, Total: 1.9 g/dL (ref 1.5–4.5)
Glucose: 80 mg/dL (ref 70–99)
Potassium: 5.5 mmol/L — ABNORMAL HIGH (ref 3.5–5.2)
Sodium: 141 mmol/L (ref 134–144)
Total Protein: 6.3 g/dL (ref 6.0–8.5)
eGFR: 104 mL/min/{1.73_m2} (ref >59)

## 2022-06-03 NOTE — Telephone Encounter (Signed)
Pt aware potassium wasn't elevated much. Pt states she don't eat fruits or veggies but did eat a new breakfast meal and that was the only thing different. She don't know how much potassium was in that. Pt was advised to increase water intake and that should take care of it. Pt voiced understanding. JSY

## 2022-06-03 NOTE — Telephone Encounter (Signed)
Patient wants to know what she can do to lower her potassium level. She states she doesn't drink sports drinks. Please advise.

## 2022-06-06 DIAGNOSIS — Z419 Encounter for procedure for purposes other than remedying health state, unspecified: Secondary | ICD-10-CM | POA: Diagnosis not present

## 2022-06-25 DIAGNOSIS — I10 Essential (primary) hypertension: Secondary | ICD-10-CM | POA: Diagnosis not present

## 2022-06-25 DIAGNOSIS — G629 Polyneuropathy, unspecified: Secondary | ICD-10-CM | POA: Diagnosis not present

## 2022-06-25 DIAGNOSIS — Z6824 Body mass index (BMI) 24.0-24.9, adult: Secondary | ICD-10-CM | POA: Diagnosis not present

## 2022-06-25 DIAGNOSIS — J329 Chronic sinusitis, unspecified: Secondary | ICD-10-CM | POA: Diagnosis not present

## 2022-06-27 DIAGNOSIS — I10 Essential (primary) hypertension: Secondary | ICD-10-CM | POA: Diagnosis not present

## 2022-06-27 DIAGNOSIS — E890 Postprocedural hypothyroidism: Secondary | ICD-10-CM | POA: Diagnosis not present

## 2022-07-04 DIAGNOSIS — E0500 Thyrotoxicosis with diffuse goiter without thyrotoxic crisis or storm: Secondary | ICD-10-CM | POA: Diagnosis not present

## 2022-07-04 DIAGNOSIS — I10 Essential (primary) hypertension: Secondary | ICD-10-CM | POA: Diagnosis not present

## 2022-07-04 DIAGNOSIS — E890 Postprocedural hypothyroidism: Secondary | ICD-10-CM | POA: Diagnosis not present

## 2022-07-06 DIAGNOSIS — Z419 Encounter for procedure for purposes other than remedying health state, unspecified: Secondary | ICD-10-CM | POA: Diagnosis not present

## 2022-08-06 DIAGNOSIS — Z419 Encounter for procedure for purposes other than remedying health state, unspecified: Secondary | ICD-10-CM | POA: Diagnosis not present

## 2022-08-26 IMAGING — CT CT HEAD W/O CM
4 series · 16 of 47 positions shown, 18 images · non-contrast
Comparison: None Available.

CLINICAL DATA: Headache, chronic, new features or increased
frequency



[Series 2: head w o · axial · 0.39mm/px · z∈[+51,+151]mm · 7 of 28 slices shown, 9 images]
[im 4/28  brain]
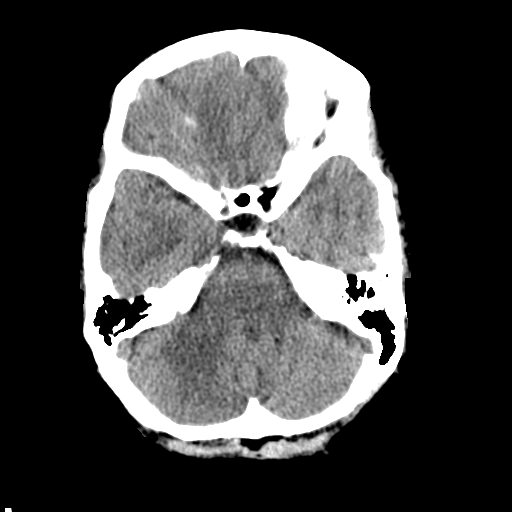
[im 4/28  bone]
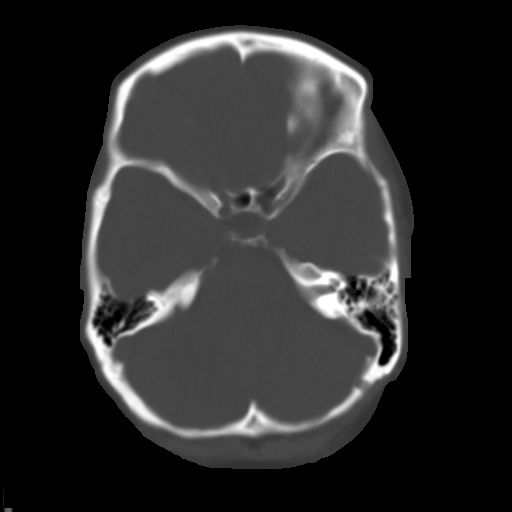
[im 7/28  brain]
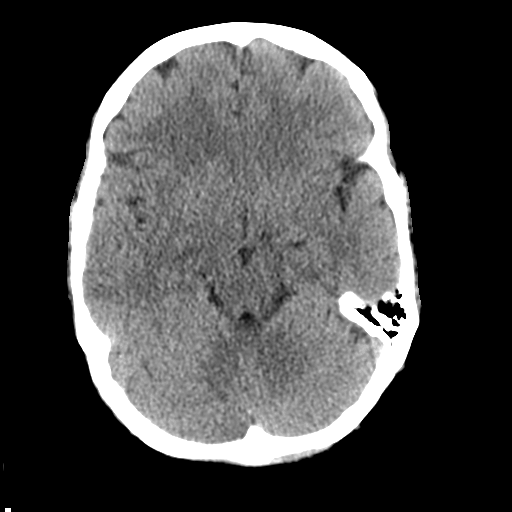
[im 11/28  brain]
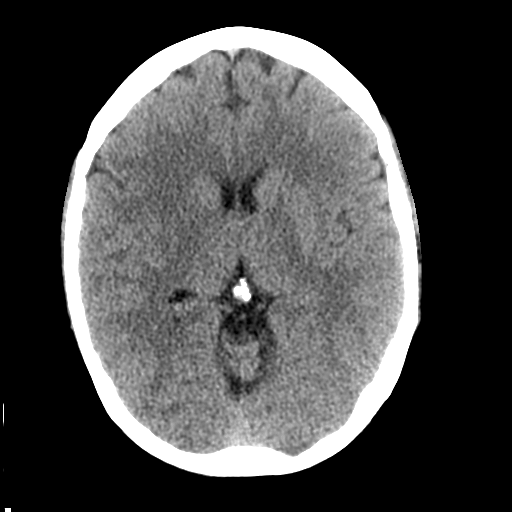
[im 14/28  brain]
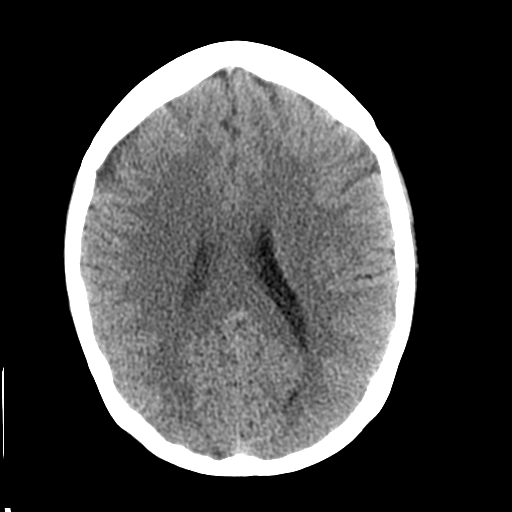
[im 17/28  brain]
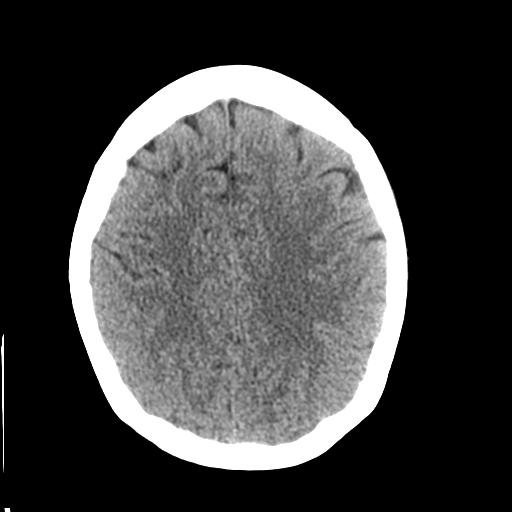
[im 17/28  bone]
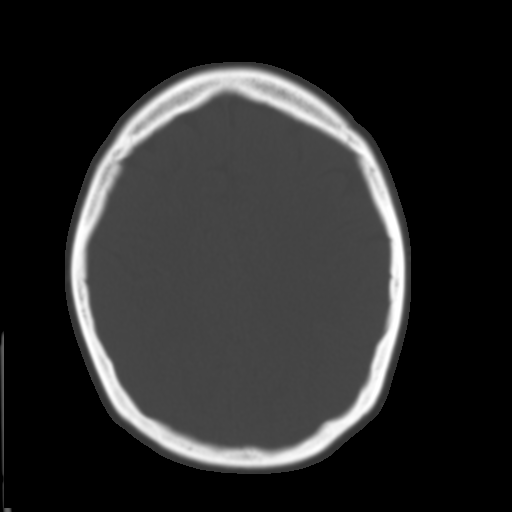
[im 21/28  brain]
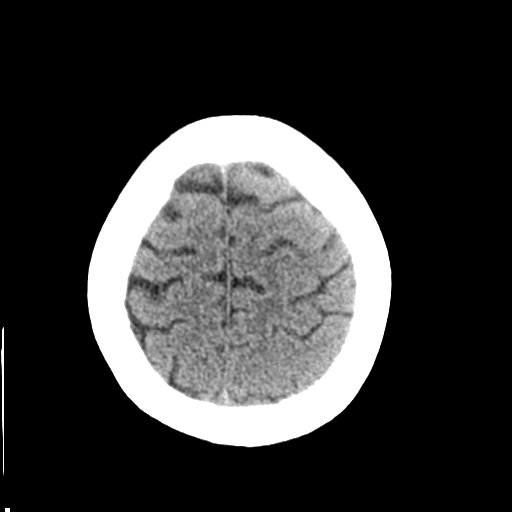
[im 24/28  brain]
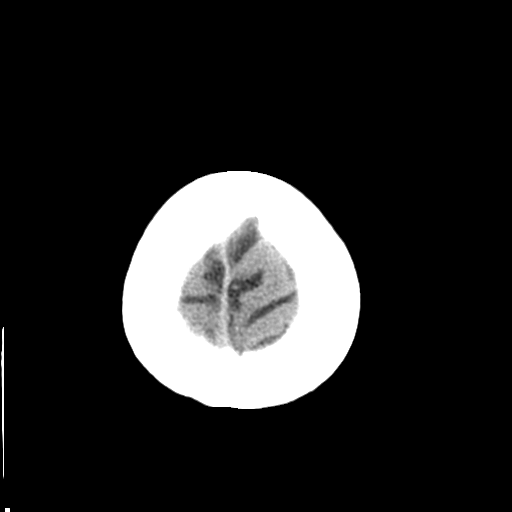

[Series 3: head bone · axial · 0.39mm/px · z∈[+48,+76]mm · 3 of 69 slices shown]
[im 7/69  bone]
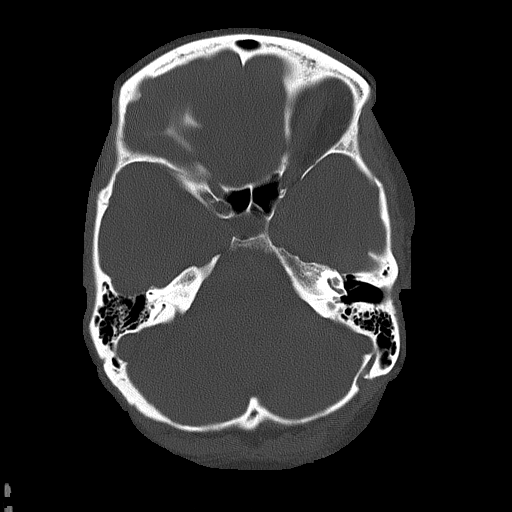
[im 14/69  bone]
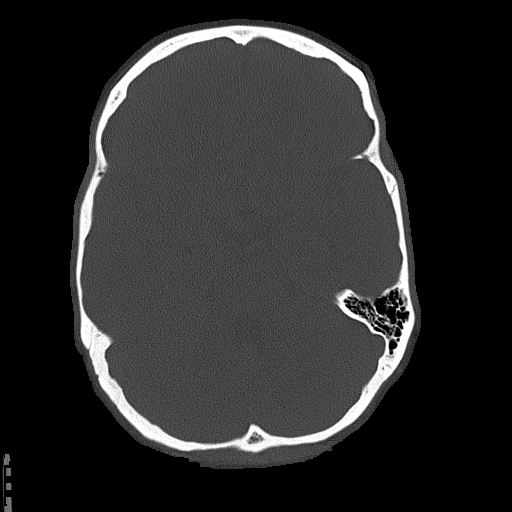
[im 21/69  bone]
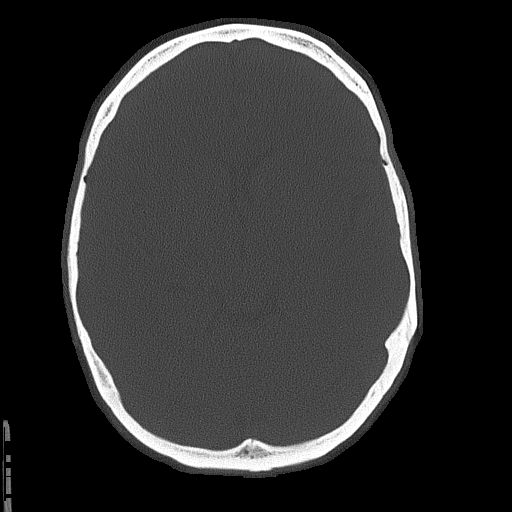

[Series 4: coronal soft · coronal · 0.29mm/px · 3 of 68 slices shown]
[im 23/68  brain]
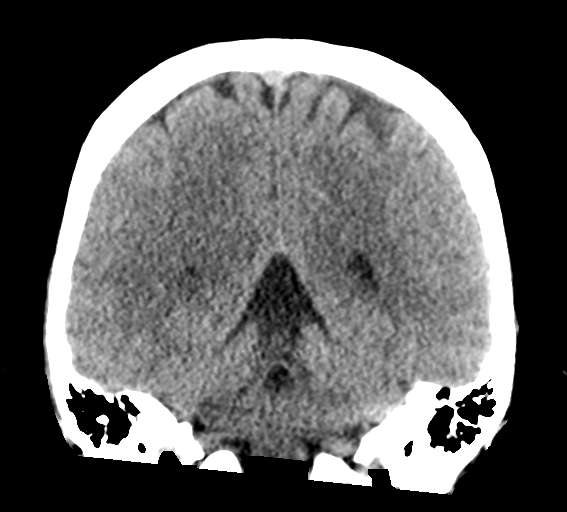
[im 30/68  brain]
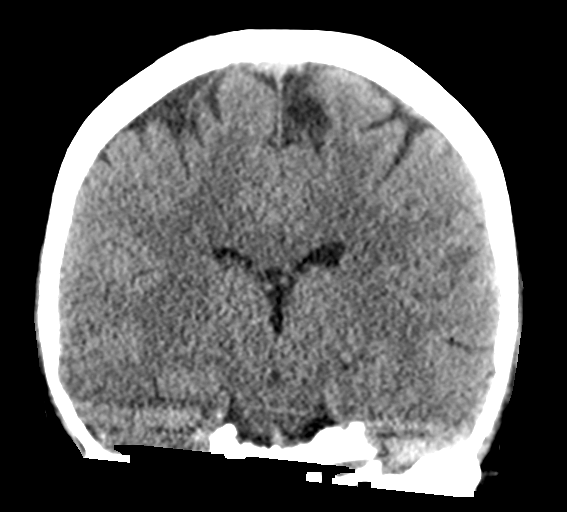
[im 38/68  brain]
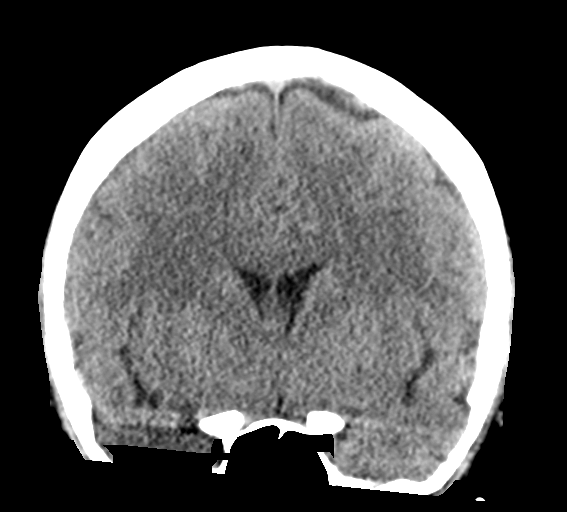

[Series 5: sagittal soft · sagittal · 0.31mm/px · 3 of 58 slices shown]
[im 20/58  brain]
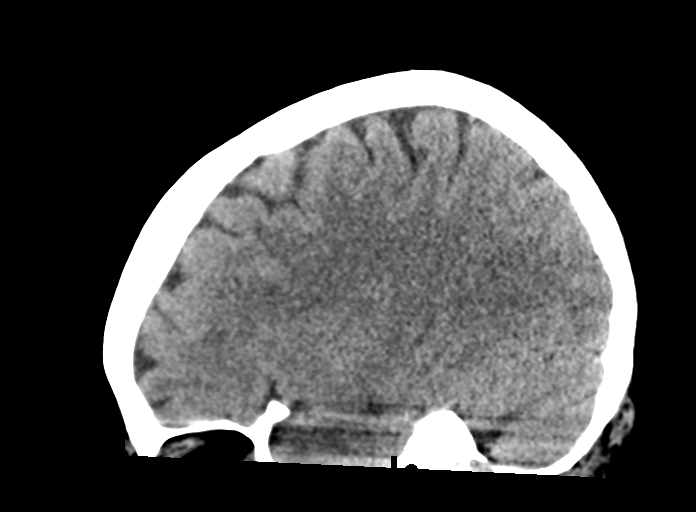
[im 29/58  brain]
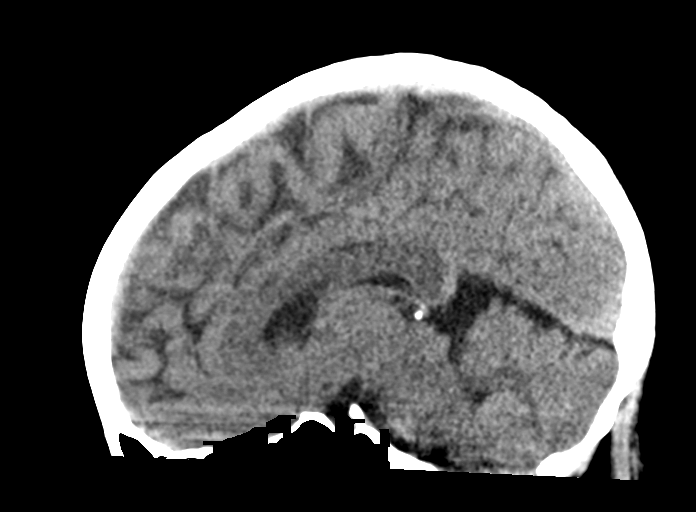
[im 39/58  brain]
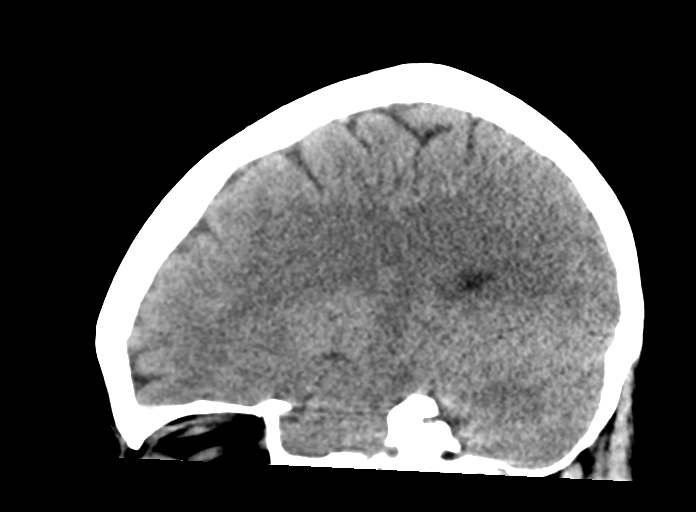

[16 of 47 positions shown; findings below may reference images not displayed]

FINDINGS: Brain: Portions of the anterior inferior right temporal lobe are
inadvertently excluded from the field of view. No acute hemorrhage,
ischemia, hydrocephalus, midline shift or mass/mass effect. No
subdural or extra-axial collection.

Vascular: No hyperdense vessel or unexpected calcification.

Skull: No fracture or focal lesion.

Sinuses/Orbits: No acute findings.  No mastoid effusion.

Other: None.
IMPRESSION: No acute intracranial abnormality or explanation for symptoms.

## 2022-09-03 DIAGNOSIS — E890 Postprocedural hypothyroidism: Secondary | ICD-10-CM | POA: Diagnosis not present

## 2022-09-05 DIAGNOSIS — Z419 Encounter for procedure for purposes other than remedying health state, unspecified: Secondary | ICD-10-CM | POA: Diagnosis not present

## 2022-10-06 DIAGNOSIS — Z419 Encounter for procedure for purposes other than remedying health state, unspecified: Secondary | ICD-10-CM | POA: Diagnosis not present

## 2022-11-06 DIAGNOSIS — Z419 Encounter for procedure for purposes other than remedying health state, unspecified: Secondary | ICD-10-CM | POA: Diagnosis not present

## 2022-11-17 DIAGNOSIS — Z0001 Encounter for general adult medical examination with abnormal findings: Secondary | ICD-10-CM | POA: Diagnosis not present

## 2022-11-17 DIAGNOSIS — E663 Overweight: Secondary | ICD-10-CM | POA: Diagnosis not present

## 2022-11-17 DIAGNOSIS — Z6826 Body mass index (BMI) 26.0-26.9, adult: Secondary | ICD-10-CM | POA: Diagnosis not present

## 2022-11-17 DIAGNOSIS — I10 Essential (primary) hypertension: Secondary | ICD-10-CM | POA: Diagnosis not present

## 2022-11-17 DIAGNOSIS — K219 Gastro-esophageal reflux disease without esophagitis: Secondary | ICD-10-CM | POA: Diagnosis not present

## 2022-11-17 DIAGNOSIS — Z1331 Encounter for screening for depression: Secondary | ICD-10-CM | POA: Diagnosis not present

## 2022-11-17 DIAGNOSIS — G629 Polyneuropathy, unspecified: Secondary | ICD-10-CM | POA: Diagnosis not present

## 2022-11-18 ENCOUNTER — Other Ambulatory Visit (HOSPITAL_COMMUNITY): Payer: Self-pay | Admitting: Internal Medicine

## 2022-11-18 DIAGNOSIS — R748 Abnormal levels of other serum enzymes: Secondary | ICD-10-CM

## 2022-11-19 DIAGNOSIS — R748 Abnormal levels of other serum enzymes: Secondary | ICD-10-CM | POA: Diagnosis not present

## 2022-11-19 DIAGNOSIS — E063 Autoimmune thyroiditis: Secondary | ICD-10-CM | POA: Diagnosis not present

## 2022-11-20 DIAGNOSIS — E890 Postprocedural hypothyroidism: Secondary | ICD-10-CM | POA: Diagnosis not present

## 2022-11-20 DIAGNOSIS — I10 Essential (primary) hypertension: Secondary | ICD-10-CM | POA: Diagnosis not present

## 2022-11-27 ENCOUNTER — Ambulatory Visit (HOSPITAL_COMMUNITY)
Admission: RE | Admit: 2022-11-27 | Discharge: 2022-11-27 | Disposition: A | Discharge status: Home / Self Care | Payer: Medicaid Other | Source: Ambulatory Visit | Attending: Internal Medicine | Admitting: Internal Medicine

## 2022-11-27 DIAGNOSIS — R748 Abnormal levels of other serum enzymes: Secondary | ICD-10-CM | POA: Diagnosis not present

## 2022-11-27 DIAGNOSIS — R945 Abnormal results of liver function studies: Secondary | ICD-10-CM | POA: Diagnosis not present

## 2022-11-27 DIAGNOSIS — R7989 Other specified abnormal findings of blood chemistry: Secondary | ICD-10-CM | POA: Diagnosis not present

## 2022-12-05 DIAGNOSIS — Z419 Encounter for procedure for purposes other than remedying health state, unspecified: Secondary | ICD-10-CM | POA: Diagnosis not present

## 2022-12-09 DIAGNOSIS — K760 Fatty (change of) liver, not elsewhere classified: Secondary | ICD-10-CM | POA: Diagnosis not present

## 2022-12-09 DIAGNOSIS — R748 Abnormal levels of other serum enzymes: Secondary | ICD-10-CM | POA: Diagnosis not present

## 2023-01-05 DIAGNOSIS — Z419 Encounter for procedure for purposes other than remedying health state, unspecified: Secondary | ICD-10-CM | POA: Diagnosis not present

## 2023-01-20 DIAGNOSIS — E890 Postprocedural hypothyroidism: Secondary | ICD-10-CM | POA: Diagnosis not present

## 2023-02-04 DIAGNOSIS — Z419 Encounter for procedure for purposes other than remedying health state, unspecified: Secondary | ICD-10-CM | POA: Diagnosis not present

## 2023-02-23 DIAGNOSIS — Z6823 Body mass index (BMI) 23.0-23.9, adult: Secondary | ICD-10-CM | POA: Diagnosis not present

## 2023-02-23 DIAGNOSIS — E559 Vitamin D deficiency, unspecified: Secondary | ICD-10-CM | POA: Diagnosis not present

## 2023-02-23 DIAGNOSIS — E890 Postprocedural hypothyroidism: Secondary | ICD-10-CM | POA: Diagnosis not present

## 2023-02-23 DIAGNOSIS — E063 Autoimmune thyroiditis: Secondary | ICD-10-CM | POA: Diagnosis not present

## 2023-02-23 DIAGNOSIS — R748 Abnormal levels of other serum enzymes: Secondary | ICD-10-CM | POA: Diagnosis not present

## 2023-02-23 DIAGNOSIS — K760 Fatty (change of) liver, not elsewhere classified: Secondary | ICD-10-CM | POA: Diagnosis not present

## 2023-03-07 DIAGNOSIS — Z419 Encounter for procedure for purposes other than remedying health state, unspecified: Secondary | ICD-10-CM | POA: Diagnosis not present

## 2023-03-11 ENCOUNTER — Other Ambulatory Visit (HOSPITAL_COMMUNITY): Payer: Self-pay | Admitting: Family Medicine

## 2023-03-11 ENCOUNTER — Ambulatory Visit (HOSPITAL_COMMUNITY)
Admission: RE | Admit: 2023-03-11 | Discharge: 2023-03-11 | Disposition: A | Discharge status: Home / Self Care | Payer: Medicaid Other | Source: Ambulatory Visit | Attending: Family Medicine | Admitting: Family Medicine

## 2023-03-11 DIAGNOSIS — S8991XA Unspecified injury of right lower leg, initial encounter: Secondary | ICD-10-CM

## 2023-03-11 DIAGNOSIS — M25561 Pain in right knee: Secondary | ICD-10-CM | POA: Diagnosis not present

## 2023-03-11 DIAGNOSIS — Z6822 Body mass index (BMI) 22.0-22.9, adult: Secondary | ICD-10-CM | POA: Diagnosis not present

## 2023-03-13 DIAGNOSIS — E890 Postprocedural hypothyroidism: Secondary | ICD-10-CM | POA: Diagnosis not present

## 2023-04-02 DIAGNOSIS — L814 Other melanin hyperpigmentation: Secondary | ICD-10-CM | POA: Diagnosis not present

## 2023-04-06 DIAGNOSIS — Z419 Encounter for procedure for purposes other than remedying health state, unspecified: Secondary | ICD-10-CM | POA: Diagnosis not present

## 2023-05-07 DIAGNOSIS — Z419 Encounter for procedure for purposes other than remedying health state, unspecified: Secondary | ICD-10-CM | POA: Diagnosis not present

## 2023-06-07 DIAGNOSIS — Z419 Encounter for procedure for purposes other than remedying health state, unspecified: Secondary | ICD-10-CM | POA: Diagnosis not present

## 2023-07-07 DIAGNOSIS — Z419 Encounter for procedure for purposes other than remedying health state, unspecified: Secondary | ICD-10-CM | POA: Diagnosis not present

## 2023-07-16 DIAGNOSIS — J329 Chronic sinusitis, unspecified: Secondary | ICD-10-CM | POA: Diagnosis not present

## 2023-07-16 DIAGNOSIS — R6889 Other general symptoms and signs: Secondary | ICD-10-CM | POA: Diagnosis not present

## 2023-07-16 DIAGNOSIS — Z20828 Contact with and (suspected) exposure to other viral communicable diseases: Secondary | ICD-10-CM | POA: Diagnosis not present

## 2023-07-16 DIAGNOSIS — Z6822 Body mass index (BMI) 22.0-22.9, adult: Secondary | ICD-10-CM | POA: Diagnosis not present

## 2023-08-07 DIAGNOSIS — Z419 Encounter for procedure for purposes other than remedying health state, unspecified: Secondary | ICD-10-CM | POA: Diagnosis not present

## 2023-08-12 DIAGNOSIS — E890 Postprocedural hypothyroidism: Secondary | ICD-10-CM | POA: Diagnosis not present

## 2023-08-19 DIAGNOSIS — E0500 Thyrotoxicosis with diffuse goiter without thyrotoxic crisis or storm: Secondary | ICD-10-CM | POA: Diagnosis not present

## 2023-08-19 DIAGNOSIS — E890 Postprocedural hypothyroidism: Secondary | ICD-10-CM | POA: Diagnosis not present

## 2023-08-19 DIAGNOSIS — I10 Essential (primary) hypertension: Secondary | ICD-10-CM | POA: Diagnosis not present

## 2023-09-06 DIAGNOSIS — Z419 Encounter for procedure for purposes other than remedying health state, unspecified: Secondary | ICD-10-CM | POA: Diagnosis not present

## 2023-10-07 DIAGNOSIS — Z419 Encounter for procedure for purposes other than remedying health state, unspecified: Secondary | ICD-10-CM | POA: Diagnosis not present

## 2023-10-12 DIAGNOSIS — F419 Anxiety disorder, unspecified: Secondary | ICD-10-CM | POA: Diagnosis not present

## 2023-10-12 DIAGNOSIS — J329 Chronic sinusitis, unspecified: Secondary | ICD-10-CM | POA: Diagnosis not present

## 2023-10-12 DIAGNOSIS — I10 Essential (primary) hypertension: Secondary | ICD-10-CM | POA: Diagnosis not present

## 2023-10-12 DIAGNOSIS — Z6822 Body mass index (BMI) 22.0-22.9, adult: Secondary | ICD-10-CM | POA: Diagnosis not present

## 2023-10-30 DIAGNOSIS — E890 Postprocedural hypothyroidism: Secondary | ICD-10-CM | POA: Diagnosis not present

## 2023-11-07 DIAGNOSIS — Z419 Encounter for procedure for purposes other than remedying health state, unspecified: Secondary | ICD-10-CM | POA: Diagnosis not present

## 2023-11-19 DIAGNOSIS — Z1331 Encounter for screening for depression: Secondary | ICD-10-CM | POA: Diagnosis not present

## 2023-11-19 DIAGNOSIS — Z9229 Personal history of other drug therapy: Secondary | ICD-10-CM | POA: Diagnosis not present

## 2023-11-19 DIAGNOSIS — K760 Fatty (change of) liver, not elsewhere classified: Secondary | ICD-10-CM | POA: Diagnosis not present

## 2023-11-19 DIAGNOSIS — K219 Gastro-esophageal reflux disease without esophagitis: Secondary | ICD-10-CM | POA: Diagnosis not present

## 2023-11-19 DIAGNOSIS — I10 Essential (primary) hypertension: Secondary | ICD-10-CM | POA: Diagnosis not present

## 2023-11-19 DIAGNOSIS — G629 Polyneuropathy, unspecified: Secondary | ICD-10-CM | POA: Diagnosis not present

## 2023-11-19 DIAGNOSIS — Z6823 Body mass index (BMI) 23.0-23.9, adult: Secondary | ICD-10-CM | POA: Diagnosis not present

## 2023-11-19 DIAGNOSIS — R748 Abnormal levels of other serum enzymes: Secondary | ICD-10-CM | POA: Diagnosis not present

## 2023-11-19 DIAGNOSIS — Z0001 Encounter for general adult medical examination with abnormal findings: Secondary | ICD-10-CM | POA: Diagnosis not present

## 2023-11-19 DIAGNOSIS — E063 Autoimmune thyroiditis: Secondary | ICD-10-CM | POA: Diagnosis not present

## 2023-12-05 DIAGNOSIS — Z419 Encounter for procedure for purposes other than remedying health state, unspecified: Secondary | ICD-10-CM | POA: Diagnosis not present

## 2023-12-08 ENCOUNTER — Ambulatory Visit (INDEPENDENT_AMBULATORY_CARE_PROVIDER_SITE_OTHER): Payer: Medicaid Other | Admitting: Adult Health

## 2023-12-08 ENCOUNTER — Encounter: Payer: Self-pay | Admitting: Adult Health

## 2023-12-08 VITALS — BP 136/83 | HR 68 | Ht 63.0 in | Wt 134.0 lb

## 2023-12-08 DIAGNOSIS — N926 Irregular menstruation, unspecified: Secondary | ICD-10-CM | POA: Diagnosis not present

## 2023-12-08 DIAGNOSIS — N941 Unspecified dyspareunia: Secondary | ICD-10-CM | POA: Diagnosis not present

## 2023-12-08 DIAGNOSIS — R4589 Other symptoms and signs involving emotional state: Secondary | ICD-10-CM | POA: Insufficient documentation

## 2023-12-08 DIAGNOSIS — G479 Sleep disorder, unspecified: Secondary | ICD-10-CM | POA: Diagnosis not present

## 2023-12-08 DIAGNOSIS — R232 Flushing: Secondary | ICD-10-CM | POA: Diagnosis not present

## 2023-12-08 DIAGNOSIS — F17200 Nicotine dependence, unspecified, uncomplicated: Secondary | ICD-10-CM | POA: Insufficient documentation

## 2023-12-08 DIAGNOSIS — N951 Menopausal and female climacteric states: Secondary | ICD-10-CM

## 2023-12-08 DIAGNOSIS — R61 Generalized hyperhidrosis: Secondary | ICD-10-CM | POA: Insufficient documentation

## 2023-12-08 MED ORDER — PROGESTERONE MICRONIZED 100 MG PO CAPS: ORAL_CAPSULE | ORAL | 1 refills | Status: DC

## 2023-12-08 NOTE — Progress Notes (Signed)
  Subjective:     Patient ID: Kristin [name], female   DOB: Oct 24, 1975, 48 y.o.   MRN: [id number]  HPI Kristin [name] is a 48 year old white female, with SO, [id number], in complaining of hot flashes, night sweats, sleep disturbance and pain with sex, positional. And is moody.      Component Value Date/Time   DIAGPAP  05/16/2022 0926    - Negative for intraepithelial lesion or malignancy (NILM)   DIAGPAP  07/11/2019 1355    - Negative for intraepithelial lesion or malignancy (NILM)   HPVHIGH Negative 05/16/2022 0926   HPVHIGH Negative 07/11/2019 1355   ADEQPAP  05/16/2022 0926    Satisfactory for evaluation; transformation zone component PRESENT.   ADEQPAP  07/11/2019 1355    Satisfactory for evaluation; transformation zone component PRESENT.   PCP is Dr Sherwood Gambler  Review of Systems  +hot flashes,  +night sweats,  +sleep disturbance + pain with sex, positional.    +moody Reviewed past medical,surgical, social and family history. Reviewed medications and allergies.  Objective:   Physical Exam BP 136/83 (BP Location: Left Arm, Patient Position: Sitting, Cuff Size: Normal)   Pulse 68   Ht 5\' 3"  (1.6 m)   Wt 134 lb (60.8 kg)   BMI 23.74 kg/m     Skin warm and dry.  Lungs: clear to ausculation bilaterally. Cardiovascular: regular rate and rhythm.  Fall risk is low  Upstream - 12/08/23 1442       Pregnancy Intention Screening   Does the patient want to become pregnant in the next year? No    Does the patient's partner want to become pregnant in the next year? No    Would the patient like to discuss contraceptive options today? No      Contraception Wrap Up   Current Method Female Sterilization    End Method Female Sterilization    Contraception Counseling Provided No             Assessment:     1. Irregular periods Skipping periods   2. Peri-menopause  3. Hot flashes  4. Night sweats (Primary) Night sweats, and wakes up Will try Prometrium 100 mg 1 daily at bedtime   5.  Sleep disturbance  6. Moody  7. Smoker Is down to 3 a day, try to stop, if stops can add estrogen later   8. Dyspareunia, female Positional     Plan:     Follow up in 5 weeks

## 2023-12-16 DIAGNOSIS — E890 Postprocedural hypothyroidism: Secondary | ICD-10-CM | POA: Diagnosis not present

## 2024-01-12 ENCOUNTER — Ambulatory Visit: Admitting: Adult Health

## 2024-01-16 DIAGNOSIS — Z419 Encounter for procedure for purposes other than remedying health state, unspecified: Secondary | ICD-10-CM | POA: Diagnosis not present

## 2024-02-15 DIAGNOSIS — Z419 Encounter for procedure for purposes other than remedying health state, unspecified: Secondary | ICD-10-CM | POA: Diagnosis not present

## 2024-03-17 DIAGNOSIS — Z419 Encounter for procedure for purposes other than remedying health state, unspecified: Secondary | ICD-10-CM | POA: Diagnosis not present

## 2024-04-11 DIAGNOSIS — E890 Postprocedural hypothyroidism: Secondary | ICD-10-CM | POA: Diagnosis not present

## 2024-04-16 DIAGNOSIS — Z419 Encounter for procedure for purposes other than remedying health state, unspecified: Secondary | ICD-10-CM | POA: Diagnosis not present

## 2024-04-19 DIAGNOSIS — Z6823 Body mass index (BMI) 23.0-23.9, adult: Secondary | ICD-10-CM | POA: Diagnosis not present

## 2024-04-19 DIAGNOSIS — F419 Anxiety disorder, unspecified: Secondary | ICD-10-CM | POA: Diagnosis not present

## 2024-04-28 DIAGNOSIS — F419 Anxiety disorder, unspecified: Secondary | ICD-10-CM | POA: Diagnosis not present

## 2024-04-28 DIAGNOSIS — F329 Major depressive disorder, single episode, unspecified: Secondary | ICD-10-CM | POA: Diagnosis not present

## 2024-04-28 DIAGNOSIS — Z6823 Body mass index (BMI) 23.0-23.9, adult: Secondary | ICD-10-CM | POA: Diagnosis not present

## 2024-05-17 DIAGNOSIS — Z419 Encounter for procedure for purposes other than remedying health state, unspecified: Secondary | ICD-10-CM | POA: Diagnosis not present

## 2024-06-17 DIAGNOSIS — Z419 Encounter for procedure for purposes other than remedying health state, unspecified: Secondary | ICD-10-CM | POA: Diagnosis not present
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
## 2024-06-28 ENCOUNTER — Other Ambulatory Visit: Payer: Self-pay

## 2024-06-28 ENCOUNTER — Ambulatory Visit (HOSPITAL_BASED_OUTPATIENT_CLINIC_OR_DEPARTMENT_OTHER): Payer: Self-pay | Admitting: Psychiatry

## 2024-06-28 ENCOUNTER — Encounter (HOSPITAL_COMMUNITY): Payer: Self-pay | Admitting: Psychiatry

## 2024-06-28 VITALS — BP 142/87 | HR 70 | Ht 64.0 in | Wt 139.0 lb

## 2024-06-28 DIAGNOSIS — F410 Panic disorder [episodic paroxysmal anxiety] without agoraphobia: Secondary | ICD-10-CM

## 2024-06-28 DIAGNOSIS — F341 Dysthymic disorder: Secondary | ICD-10-CM | POA: Diagnosis not present

## 2024-06-28 DIAGNOSIS — F411 Generalized anxiety disorder: Secondary | ICD-10-CM | POA: Diagnosis not present

## 2024-06-28 DIAGNOSIS — F17200 Nicotine dependence, unspecified, uncomplicated: Secondary | ICD-10-CM | POA: Diagnosis not present

## 2024-06-28 MED ORDER — PROPRANOLOL HCL 10 MG PO TABS: 10.0000 mg | ORAL_TABLET | Freq: Two times a day (BID) | ORAL | 2 refills | Status: AC | PRN

## 2024-06-28 MED ORDER — TRAZODONE HCL 50 MG PO TABS: 25.0000 mg | ORAL_TABLET | Freq: Every day | ORAL | 2 refills | Status: DC

## 2024-06-28 MED ORDER — FLUOXETINE HCL 10 MG PO CAPS: 10.0000 mg | ORAL_CAPSULE | Freq: Every day | ORAL | 2 refills | Status: DC

## 2024-08-18 DIAGNOSIS — E890 Postprocedural hypothyroidism: Secondary | ICD-10-CM | POA: Diagnosis not present

## 2024-08-22 NOTE — Progress Notes (Unsigned)
 BH MD/PA/NP OP Progress Note  08/23/2024 11:13 AM Kristin [name]  MRN:  [id number]  Visit Diagnosis:    ICD-10-CM   1. Generalized anxiety disorder with panic attacks  F41.1    F41.0     2. Dysthymia  F34.1     3. Nicotine use disorder  F17.200       Assessment: Kristin [name] is a 48 y.o. female with a history of Graves' disease, anxiety, and nicotine use disorder who presented to [facility] for initial evaluation on 06/28/2024.    At initial evaluation patient reported symptoms of anxiety/panic including excessive worry, fear of something awful happening, feelings of being overwhelmed, racing thoughts, increased irritability, chest pressure, shortness of breath, palpitations, and body tension.  Furthermore she endorsed neurovegetative symptoms of depression including disturbed sleep, anhedonia, fatigue, and poor concentration.  Psychosocially [name] have been several stressors including her mother's Alzheimer's, relationship, and caring for her kids.  Patient met criteria for GAD with panic, dysthymia, and nicotine use disorder.  Kristin [name] presents for follow-up evaluation. Today, 08/23/24, patient is still struggling with low mood symptoms and anxiety.  [name] have been ongoing psychosocial stressors as well as the addition of watching her mother after she fell and broke her hip.  Patient did not start the medications in the interim due to concern of adverse side effects.  She has been using the coping mechanisms with benefit and decreased her Xanax  use to as needed.  Reportedly she has only used 0.25 mg twice in the past 2 months.  Given anxiety around medications we will discontinue trazodone  and hold Prozac  at this time.  We will start propranolol  10 mg twice a day as needed and reviewed the risk and benefits.  Can reconsider Prozac  in the future once patient's mother recovers.  Risk Assessment: An assessment of suicide and violence risk factors was  performed as part of this evaluation and is not significantly changed from the last visit. While future psychiatric events cannot be accurately predicted, the patient does not currently require acute inpatient psychiatric care and does not currently meet   involuntary commitment criteria. Patient was given contact information for crisis resources, behavioral health clinic and was instructed to call 911 for emergencies.   Plan: # GAD with panic episodes/dysthymia Past medication trials: Lexapro Status of problem: Ongoing Interventions: - Hold Prozac  10 mg daily - Start propranolol  10 mg twice a day as needed for anxiety - Discontinue trazodone  25 to 50 mg nightly as needed for insomnia - Continue Xanax  0.5 mg twice daily as needed for anxiety second-line (managed by PCP), only used 0.25 mg twice in a 63-month interim  # Nicotine use disorder Past medication trials:  Status of problem: Ongoing Interventions: -- Continue nicotine patch 14 mg daily managed by PCP  Chief Complaint:  Chief Complaint  Patient presents with   Follow-up   HPI: Kristin [name] today alongside her son who was present with her permission.  She reports feeling bad as she did not start the medication in the interim.  This was related to her anxiety with medications.  Patient reports given the prior negative experience on Lexapro she is hesitant to try others.  Furthermore her mother had a fall a month ago and broke her hip.  After surgery she was placed in a nursing home but Kristin [name] given concern for her mother wandering given her demented state.  Patient expresses being fearful that taking  a medication would negatively impact her ability to care for her responsibilities.  She has been using the coping skills we discussed including the breathing techniques and exercising of good effect.  Patient has also decreased her Xanax  use and only used 0.25 mg twice in the interim.  On both  occasions it was helpful.  Reviewed this medication and how her current use was appropriate.  Patient does still endorse some difficulties with mood lability and low mood.  Given significant anxiety around medications we suggested only starting 1 medication today.  We will discontinue the trazodone  and hold the Prozac .  Will start the propranolol  as needed and reviewed the risk and benefits.  Patient expressed feeling more comfortable with this plan.  Past Psychiatric History:  Past psychiatric diagnoses: Denies Psychiatric hospitalizations: Denies Past suicide attempts: Denies Hx of self harm: Denies Hx of violence towards others: Denies Prior psychiatric providers: Denies Prior therapy: Denies Access to firearms: Yes safely secured  Prior medication trials: Lexapro (anxiety and insomnia possible that this was induced about looking at)  Substance use: Reports social alcohol use and drink 1-5 drinks at a time varies on time year.  Reports 20 years of nicotine use of anywhere from 2 to 10 cigarettes a day on nicotine patch. Marijuana at age 10 one trial. Not since.  Past Medical History:  Past Medical History:  Diagnosis Date   Anemia    Anxiety    Essential hypertension    GERD (gastroesophageal reflux disease)    Graves disease    History of UTI    Hyperthyroidism    Supervision of normal intrauterine pregnancy in multigravida 10/08/2011   Thyrotoxicosis     Past Surgical History:  Procedure Laterality Date   ECTOPIC PREGNANCY SURGERY     EYE SURGERY  09/09/2017   orbital decompression    TUBAL LIGATION  10/08/2011   Procedure: POST PARTUM TUBAL LIGATION;  Surgeon: Winton Felt, MD;  Location: [facility];  Service: Gynecology;  Laterality: Bilateral;   WISDOM TOOTH EXTRACTION      Family History:  Family History  Problem Relation Age of Onset   Diabetes Father    Heart disease Father 66       MI   High Cholesterol Mother    Hypertension Mother    Liver disease Sister     Heart disease Maternal Grandmother    Lupus Paternal Grandmother     Social History:  Social History   Socioeconomic History   Marital status: Significant Other    Spouse name: Not on file   Number of children: 2   Years of education: Not on file   Highest education level: Not on file  Occupational History   Occupation: [employer]  Tobacco Use   Smoking status: Every Day    Current packs/day: 0.00    Average packs/day: 0.5 packs/day for 20.0 years (10.0 ttl pk-yrs)    Types: Cigarettes    Start date: 04/22/1998    Last attempt to quit: 04/22/2018    Years since quitting: 6.3   Smokeless tobacco: Never  Vaping Use   Vaping status: Never Used  Substance and Sexual Activity   Alcohol use: Yes    Comment: occ   Drug use: No   Sexual activity: Yes    Birth control/protection: Surgical    Comment: tubal  Other Topics Concern   Not on file  Social History Narrative   Married   2 children: Age 2 y/o and 89 mos old (  as of 2014 OV)       Social Drivers of Corporate Investment Banker Strain: Not on file  Food Insecurity: Not on file  Transportation Needs: Not on file  Physical Activity: Not on file  Stress: Not on file  Social Connections: Not on file    Allergies:  Allergies  Allergen Reactions   Sulfa  Antibiotics Palpitations    Rapid heart beats,tremores,anxiety    Current Medications: Current Outpatient Medications  Medication Sig Dispense Refill   acetaminophen  (TYLENOL ) 500 MG tablet Take by mouth.     ALPRAZolam  (XANAX ) 0.5 MG tablet Take 0.5 mg by mouth 4 (four) times daily as needed.     FLUoxetine  (PROZAC ) 10 MG capsule Take 1 capsule (10 mg total) by mouth daily. 30 capsule 2   levothyroxine  (SYNTHROID ) 100 MCG tablet Take 100 mcg by mouth daily.     losartan (COZAAR) 50 MG tablet Take by mouth.     Omega-3 Fatty Acids (FISH OIL PO) Take by mouth.     pantoprazole  (PROTONIX ) 40 MG tablet Take 1 tablet (40 mg total) by mouth daily. 30 tablet 3    progesterone  (PROMETRIUM ) 100 MG capsule Take one daily at bedtime 30 capsule 1   propranolol  (INDERAL ) 10 MG tablet Take 1 tablet (10 mg total) by mouth 2 (two) times daily as needed. 60 tablet 2   traZODone  (DESYREL ) 50 MG tablet Take 0.5-1 tablets (25-50 mg total) by mouth at bedtime. 30 tablet 2   No current facility-administered medications for this visit.     Musculoskeletal: Strength & Muscle Tone: within normal limits Gait & Station: normal Patient leans: N/A  Psychiatric Specialty Exam: Blood pressure 125/87, pulse 90, height 5' 3 (1.6 m), weight 140 lb 6.4 oz (63.7 kg).Body mass index is 24.87 kg/m. Review of Systems  General Appearance: Fairly Groomed  Eye Contact:  Good  Speech:  Clear and Coherent and Normal Rate  Volume:  Normal  Mood:  Anxious and dysthymic  Affect:  Appropriate  Thought Content: Logical   Suicidal Thoughts:  No  Homicidal Thoughts:  No  Thought Process:  Coherent  Orientation:  Full (Time, Place, and Person)    Memory: Immediate;   Good  Judgment:  Fair  Insight:  Fair  Concentration:  Concentration: Fair  Recall:  not formally assessed   Fund of Knowledge: Fair  Language: Good  Psychomotor Activity:  Normal  Akathisia:  NA  AIMS (if indicated): not done  Assets:  Communication Skills Desire for Improvement Financial Resources/Insurance Housing  ADL's:  Intact  Cognition: WNL  Sleep:  Good   Metabolic Disorder Labs: No results found for: HGBA1C, MPG No results found for: PROLACTIN Lab Results  Component Value Date   CHOL 150 12/13/2013   TRIG 68 12/13/2013   HDL 43 12/13/2013   CHOLHDL 3.5 12/13/2013   VLDL 14 12/13/2013   LDLCALC 93 12/13/2013   Lab Results  Component Value Date   TSH 2.122 02/21/2016    Therapeutic Level Labs: No results found for: LITHIUM No results found for: VALPROATE No results found for: CBMZ   Screenings: PHQ2-9    Flowsheet Row [facility] Office Visit from 07/11/2019 in [facility] at [facility] Office Visit from 05/10/2018 in [facility]  PHQ-2 Total Score 6 2 0  PHQ-9 Total Score 17 7 --   Flowsheet Row ED from 04/22/2022 in [facility] Emergency Department at  [facility] ED from 03/08/2022 in [facility] Emergency Department at [facility] UC from 03/05/2022 in [facility] at [facility] RISK CATEGORY No Risk No Risk No Risk    Collaboration of Care: Collaboration of Care: Medication Management AEB medication continuation  Patient/Guardian was advised Release of Information must be obtained prior to any record release in order to collaborate their care with an outside provider. Patient/Guardian was advised if they have not already done so to contact the registration department to sign all necessary forms in order for us  to release information regarding their care.   Consent: Patient/Guardian gives verbal consent for treatment and assignment of benefits for services provided during this visit. Patient/Guardian expressed understanding and agreed to proceed.    Arvella CHRISTELLA Finder, MD 08/23/2024, 11:13 AM

## 2024-08-23 ENCOUNTER — Encounter (HOSPITAL_COMMUNITY): Payer: Self-pay | Admitting: Psychiatry

## 2024-08-23 ENCOUNTER — Other Ambulatory Visit: Payer: Self-pay

## 2024-08-23 ENCOUNTER — Ambulatory Visit (HOSPITAL_BASED_OUTPATIENT_CLINIC_OR_DEPARTMENT_OTHER): Admitting: Psychiatry

## 2024-08-23 VITALS — BP 125/87 | HR 90 | Ht 63.0 in | Wt 140.4 lb

## 2024-08-23 DIAGNOSIS — F411 Generalized anxiety disorder: Secondary | ICD-10-CM | POA: Diagnosis not present

## 2024-08-23 DIAGNOSIS — F17200 Nicotine dependence, unspecified, uncomplicated: Secondary | ICD-10-CM | POA: Diagnosis not present

## 2024-08-23 DIAGNOSIS — F341 Dysthymic disorder: Secondary | ICD-10-CM | POA: Diagnosis not present

## 2024-08-23 DIAGNOSIS — F410 Panic disorder [episodic paroxysmal anxiety] without agoraphobia: Secondary | ICD-10-CM

## 2024-08-24 DIAGNOSIS — E0501 Thyrotoxicosis with diffuse goiter with thyrotoxic crisis or storm: Secondary | ICD-10-CM | POA: Diagnosis not present

## 2024-08-24 DIAGNOSIS — K219 Gastro-esophageal reflux disease without esophagitis: Secondary | ICD-10-CM | POA: Diagnosis not present

## 2024-08-24 DIAGNOSIS — F408 Other phobic anxiety disorders: Secondary | ICD-10-CM | POA: Diagnosis not present

## 2024-08-24 DIAGNOSIS — E032 Hypothyroidism due to medicaments and other exogenous substances: Secondary | ICD-10-CM | POA: Diagnosis not present

## 2024-08-24 DIAGNOSIS — Z6824 Body mass index (BMI) 24.0-24.9, adult: Secondary | ICD-10-CM | POA: Diagnosis not present

## 2024-08-25 DIAGNOSIS — E0500 Thyrotoxicosis with diffuse goiter without thyrotoxic crisis or storm: Secondary | ICD-10-CM | POA: Diagnosis not present

## 2024-08-25 DIAGNOSIS — E890 Postprocedural hypothyroidism: Secondary | ICD-10-CM | POA: Diagnosis not present

## 2024-08-25 DIAGNOSIS — I10 Essential (primary) hypertension: Secondary | ICD-10-CM | POA: Diagnosis not present

## 2024-09-07 DIAGNOSIS — E663 Overweight: Secondary | ICD-10-CM | POA: Diagnosis not present

## 2024-09-07 DIAGNOSIS — J019 Acute sinusitis, unspecified: Secondary | ICD-10-CM | POA: Diagnosis not present

## 2024-09-07 DIAGNOSIS — I10 Essential (primary) hypertension: Secondary | ICD-10-CM | POA: Diagnosis not present

## 2024-09-07 DIAGNOSIS — Z6825 Body mass index (BMI) 25.0-25.9, adult: Secondary | ICD-10-CM | POA: Diagnosis not present

## 2024-09-13 ENCOUNTER — Emergency Department (HOSPITAL_COMMUNITY)
Admission: EM | Admit: 2024-09-13 | Discharge: 2024-09-14 | Disposition: A | Attending: Emergency Medicine | Admitting: Emergency Medicine

## 2024-09-13 ENCOUNTER — Other Ambulatory Visit: Payer: Self-pay

## 2024-09-13 ENCOUNTER — Encounter (HOSPITAL_COMMUNITY): Payer: Self-pay | Admitting: Emergency Medicine

## 2024-09-13 ENCOUNTER — Emergency Department (HOSPITAL_COMMUNITY)

## 2024-09-13 DIAGNOSIS — Z79899 Other long term (current) drug therapy: Secondary | ICD-10-CM | POA: Diagnosis not present

## 2024-09-13 DIAGNOSIS — I491 Atrial premature depolarization: Secondary | ICD-10-CM | POA: Diagnosis not present

## 2024-09-13 DIAGNOSIS — R002 Palpitations: Secondary | ICD-10-CM | POA: Diagnosis not present

## 2024-09-13 DIAGNOSIS — I10 Essential (primary) hypertension: Secondary | ICD-10-CM | POA: Diagnosis not present

## 2024-09-13 DIAGNOSIS — R0602 Shortness of breath: Secondary | ICD-10-CM | POA: Diagnosis not present

## 2024-09-13 DIAGNOSIS — R079 Chest pain, unspecified: Secondary | ICD-10-CM | POA: Diagnosis not present

## 2024-09-13 DIAGNOSIS — I493 Ventricular premature depolarization: Secondary | ICD-10-CM | POA: Diagnosis not present

## 2024-09-13 DIAGNOSIS — R918 Other nonspecific abnormal finding of lung field: Secondary | ICD-10-CM | POA: Diagnosis not present

## 2024-09-13 LAB — COMPREHENSIVE METABOLIC PANEL WITH GFR
ALT: 60 U/L — ABNORMAL HIGH (ref 0–44)
AST: 35 U/L (ref 15–41)
Albumin: 4.8 g/dL (ref 3.5–5.0)
Alkaline Phosphatase: 151 U/L — ABNORMAL HIGH (ref 38–126)
Anion gap: 16 — ABNORMAL HIGH (ref 5–15)
BUN: 18 mg/dL (ref 6–20)
CO2: 22 mmol/L (ref 22–32)
Calcium: 9.7 mg/dL (ref 8.9–10.3)
Chloride: 105 mmol/L (ref 98–111)
Creatinine, Ser: 0.68 mg/dL (ref 0.44–1.00)
GFR, Estimated: >60 mL/min (ref >60)
Glucose, Bld: 97 mg/dL (ref 70–99)
Potassium: 4.2 mmol/L (ref 3.5–5.1)
Sodium: 142 mmol/L (ref 135–145)
Total Bilirubin: 0.4 mg/dL (ref 0.0–1.2)
Total Protein: 7.3 g/dL (ref 6.5–8.1)

## 2024-09-13 LAB — CBC WITH DIFFERENTIAL/PLATELET
Abs Immature Granulocytes: 0.03 K/uL (ref 0.00–0.07)
Basophils Absolute: 0.1 K/uL (ref 0.0–0.1)
Basophils Relative: 1 %
Eosinophils Absolute: 0.2 K/uL (ref 0.0–0.5)
Eosinophils Relative: 2 %
HCT: 42.3 % (ref 36.0–46.0)
Hemoglobin: 14.4 g/dL (ref 12.0–15.0)
Immature Granulocytes: 0 %
Lymphocytes Relative: 33 %
Lymphs Abs: 2.7 K/uL (ref 0.7–4.0)
MCH: 31.1 pg (ref 26.0–34.0)
MCHC: 34 g/dL (ref 30.0–36.0)
MCV: 91.4 fL (ref 80.0–100.0)
Monocytes Absolute: 0.6 K/uL (ref 0.1–1.0)
Monocytes Relative: 8 %
Neutro Abs: 4.7 K/uL (ref 1.7–7.7)
Neutrophils Relative %: 56 %
Platelets: 190 K/uL (ref 150–400)
RBC: 4.63 MIL/uL (ref 3.87–5.11)
RDW: 13.1 % (ref 11.5–15.5)
WBC: 8.3 K/uL (ref 4.0–10.5)
nRBC: 0 % (ref 0.0–0.2)

## 2024-09-13 LAB — TROPONIN T, HIGH SENSITIVITY: Troponin T High Sensitivity: <15 ng/L (ref 0–19)

## 2024-09-13 MED ORDER — SODIUM CHLORIDE 0.9 % IV BOLUS
500.0000 mL | Freq: Once | INTRAVENOUS | Status: AC
  Administered 2024-09-13: 500 mL via INTRAVENOUS

## 2024-09-13 NOTE — ED Triage Notes (Signed)
 Pt presents stating she feels like her heart is stopping and then restarting x 1 hour, beating irregular, denies cardiac hx, reports same occurred last night, reports she took a nerve pill  went to sleep and woke up feeling okay this am

## 2024-09-14 LAB — T4, FREE: Free T4: 0.99 ng/dL (ref 0.61–1.12)

## 2024-09-14 LAB — TROPONIN T, HIGH SENSITIVITY: Troponin T High Sensitivity: <15 ng/L (ref 0–19)

## 2024-09-14 LAB — TSH: TSH: 4.34 u[IU]/mL (ref 0.350–4.500)

## 2024-09-14 LAB — MAGNESIUM: Magnesium: 2.2 mg/dL (ref 1.7–2.4)

## 2024-09-14 LAB — PRO BRAIN NATRIURETIC PEPTIDE: Pro Brain Natriuretic Peptide: <50 pg/mL (ref <300.0)

## 2024-09-14 NOTE — ED Provider Notes (Signed)
 [place] EMERGENCY DEPARTMENT AT [facility] Provider Note   CSN: [id number] Arrival date & time: 09/13/24  1956     Patient presents with: Irregular Heart Beat   Kristin [name] is a 48 y.o. female.   The patient is presenting with palpitations described as the heart skipping and jumping, which began intermittently yesterday and worsened significantly tonight. The episodes are associated with transient shortness of breath but no chest pain, lightheadedness, or syncope. The patient denies any previous similar episodes or known heart issues. There is a history of Graves' disease, thyroid  eye disease, hypertension, and gastroesophageal reflux disease. The patient is currently on levothyroxine , with no recent dosage changes, and losartan for hypertension. The patient reports regular caffeine intake and a history of smoking but denies recent illnesses, fever, cough, or gastrointestinal symptoms. The palpitations occur approximately every 15 minutes. History was obtained from the patient and the patient's sister.          Prior to Admission medications   Medication Sig Start Date End Date Taking? Authorizing Provider  acetaminophen  (TYLENOL ) 500 MG tablet Take by mouth.    [provider]  ALPRAZolam  (XANAX ) 0.5 MG tablet Take 0.5 mg by mouth 4 (four) times daily as needed. 03/06/21   [provider]  FLUoxetine  (PROZAC ) 10 MG capsule Take 1 capsule (10 mg total) by mouth daily. 06/28/24 06/28/25  Carvin Arvella CHRISTELLA, MD  levothyroxine  (SYNTHROID ) 100 MCG tablet Take 100 mcg by mouth daily. 10/12/23   [provider]  losartan (COZAAR) 50 MG tablet Take by mouth. 04/16/22   [provider]  Omega-3 Fatty Acids (FISH OIL PO) Take by mouth.    [provider]  pantoprazole  (PROTONIX ) 40 MG tablet Take 1 tablet (40 mg total) by mouth daily. 02/12/22   Haze Lonni PARAS, MD  progesterone  (PROMETRIUM ) 100 MG capsule Take one daily at bedtime  12/08/23   Signa Nest A, NP  propranolol  (INDERAL ) 10 MG tablet Take 1 tablet (10 mg total) by mouth 2 (two) times daily as needed. 06/28/24   Carvin Arvella CHRISTELLA, MD  traZODone  (DESYREL ) 50 MG tablet Take 0.5-1 tablets (25-50 mg total) by mouth at bedtime. 06/28/24   Carvin Arvella CHRISTELLA, MD    Allergies: Sulfa  antibiotics    Review of Systems  Updated Vital Signs BP 123/73   Pulse 64   Temp 98.2 F (36.8 C) (Oral)   Resp 16   Ht 5' 3 (1.6 m)   Wt 63.5 kg   SpO2 99%   BMI 24.80 kg/m   Physical Exam Vitals and nursing note reviewed.  Constitutional:      Appearance: She is well-developed.  HENT:     Head: Normocephalic and atraumatic.  Cardiovascular:     Rate and Rhythm: Normal rate and regular rhythm.     Comments: Frequent PAC's on the monitor Pulmonary:     Effort: No respiratory distress.     Breath sounds: No stridor. No wheezing or rales.  Abdominal:     General: There is no distension.  Musculoskeletal:     Cervical back: Normal range of motion.  Neurological:     Mental Status: She is alert.     (all labs ordered are listed, but only abnormal results are displayed) Labs Reviewed  COMPREHENSIVE METABOLIC PANEL WITH GFR - Abnormal; Notable for the following components:      Result Value   ALT 60 (*)    Alkaline Phosphatase 151 (*)    Anion gap  16 (*)    All other components within normal limits  CBC WITH DIFFERENTIAL/PLATELET  TSH  MAGNESIUM  PRO BRAIN NATRIURETIC PEPTIDE  T4, FREE  TROPONIN T, HIGH SENSITIVITY  TROPONIN T, HIGH SENSITIVITY    EKG: EKG Interpretation Date/Time:  Tuesday September 13 2024 20:13:01 EST Ventricular Rate:  81 PR Interval:  152 QRS Duration:  96 QT Interval:  398 QTC Calculation: 462 R Axis:   90  Text Interpretation: Normal sinus rhythm Rightward axis Incomplete right bundle branch block Borderline ECG When compared with ECG of 22-Apr-2022 04:20, PREVIOUS ECG IS PRESENT Confirmed by Lorette Mayo [id number]) on 09/13/2024  11:06:13 PM  Radiology: [facility] Chest Port 1 View Result Date: 09/13/2024 EXAM: 1 VIEW(S) XRAY OF THE CHEST 09/13/2024 10:54:00 PM COMPARISON: None available. CLINICAL HISTORY: pain FINDINGS: LUNGS AND PLEURA: No focal pulmonary opacity. Chronic bilateral costophrenic angle blunting with likely trace bilateral pleural effusions. No pneumothorax. HEART AND MEDIASTINUM: No acute abnormality of the cardiac and mediastinal silhouettes. BONES AND SOFT TISSUES: No acute osseous abnormality. IMPRESSION: 1. Trace bilateral pleural effusions are likely present. Electronically signed by: Morgane Naveau MD 09/13/2024 10:57 PM EST RP Workstation: [id number]     Procedures   Medications Ordered in the ED  sodium chloride  0.9 % bolus 500 mL (0 mLs Intravenous Stopped 09/13/24 2309)                                    Medical Decision Making Amount and/or Complexity of Data Reviewed Labs: ordered.   The patient presents with episodes of palpitations described as heart skipping or jumping, which began yesterday and worsened tonight. The patient reports shortness of breath during these episodes but denies chest pain, lightheadedness, or other systemic symptoms. The patient has a history of Graves' disease, hypertension, and reflux, and is currently taking levothyroxine  and losartan. The patient has a history of smoking and no current caffeine use.  The differential diagnosis for the patient's palpitations includes premature atrial contractions (PACs), atrial fibrillation, electrolyte imbalances, hyperthyroidism, and anxiety-related palpitations. Less likely considerations include supraventricular tachycardia, ventricular arrhythmias, and pulmonary embolism.  The patient was monitored in the emergency room for over 3 hours without any episodes of atrial fibrillation, ventricular tachycardia, ventricular fibrillation, supraventricular tachycardia, or any other malignant arrhythmias. The patient remained in sinus  rhythm. Thyroid  studies were reassuring, electrolytes were normal, and troponins were normal. The EKG, reviewed and interpreted, showed no acute ischemic changes or arrhythmias. The chest X-ray showed no acute pulmonary issues. The telemetry monitoring revealed PACs, which are not uncommon and not immediately life-threatening.  The most likely diagnosis is premature atrial contractions, supported by the telemetry findings and the absence of sustained arrhythmia or other alarming symptoms. The patient is advised to avoid caffeine and smoking, maintain hydration, and follow up with a cardiologist for further evaluation and management. The patient has propranolol  at home and will start using it for symptomatic relief. If symptoms persist, follow-up with cardiology for further workup and management is recommended. I have a low suspicion for any life-threatening arrhythmias at this point. However, I discussed at length the symptoms or reasons that would warrant a return to the emergency room related to palpitations, such as diaphoresis, pallor, syncope, near syncope, lightheadedness, nausea, chest pain, shortness of breath, or any irregular heart sensations. Additionally, if the patient experiences an irregular heartbeat for longer than a few minutes or a persistent heart rate above 120,  they should seek medical attention more immediately.   Final diagnoses:  Palpitations  PAC (premature atrial contraction)    ED Discharge Orders          Ordered    Ambulatory referral to Cardiology        09/14/24 0134               Karne Ozga, Selinda, MD 09/14/24 [phone]

## 2024-09-16 DIAGNOSIS — Z419 Encounter for procedure for purposes other than remedying health state, unspecified: Secondary | ICD-10-CM | POA: Diagnosis not present

## 2024-09-20 NOTE — Progress Notes (Unsigned)
 Cardiology Office Note    Date:  09/22/2024  ID:  Seeley, [place] 01/27/1976, MRN [id number] Cardiologist: Darryle ONEIDA Decent, MD { :  History of Present Illness:    Kristin [name] is a 48 y.o. female with past medical history of HTN, hypothyroidism and GERD who presents to the office today for follow-up from a recent Emergency Department visit.  She was last examined by Dr. Decent in 05/2022 and reported intermittent episodes of a sharp chest discomfort over the past 2 months which would last for hours at a time. She was going to the gym for several hours at a time without any anginal symptoms. It was felt that she did not require further cardiac workup and that symptoms were due to acid reflux. Was informed to follow-up with Cardiology as needed going forward.  She was most recently evaluated at [facility] ED on 09/14/2024 for episodes of her heart skipping and jumping. Her EKG showed normal sinus rhythm and she was monitored on telemetry with episodes of PAC's noted but no significant arrhythmias. Electrolytes and TSH were within a normal range. proBNP was normal and Troponin values were negative. It was felt that symptoms were due to PAC's and she was informed to follow-up with Cardiology as an outpatient.  In talking with the patient and her son today, she reports having frequent palpitations over the past several weeks. Symptoms can occur at rest or with activity. Last month, she was previously exercising routinely and running on her treadmill for up to 30 minutes at a time without any symptoms. She reports the palpitations feel like a skipped beat and she has a pause afterwards. Does feel short of breath when this occurs. No specific chest pain. No recent orthopnea, PND or pitting edema. She does not consume caffeine. Does report having chocolate on one of the days the episode occurred. No recent alcohol use. She was previously prescribed Propranolol  for anxiety and has taken this at times  with improvement in palpitations but has not utilized regularly as this drops her heart rate into the 60's. She does smoke occasionally but typically less than 1 cigarette/day.  Studies Reviewed:   EKG: EKG is not ordered today. EKG from 09/13/2024 is reviewed shows NSR, HR 81 with RAD.  Echocardiogram: 01/2015 Study Conclusions   - Left ventricle: The cavity size was normal. Systolic function was    normal. The estimated ejection fraction was in the range of 60%    to 65%. Wall motion was normal; there were no regional wall    motion abnormalities. Left ventricular diastolic function    parameters were normal.  - Aortic valve: There was no regurgitation.  - Aortic root: The aortic root was normal in size.  - Left atrium: The atrium was normal in size.  - Right ventricle: Systolic function was normal.  - Right atrium: The atrium was normal in size.  - Tricuspid valve: There was trivial regurgitation.  - Pulmonic valve: There was no regurgitation.  - Pulmonary arteries: Systolic pressure was within the normal    range.  - Inferior vena cava: The vessel was normal in size.  - Pericardium, extracardiac: There was no pericardial effusion.   Impressions:   - Normal study.    Holter Monitor: 09/2016 Telemetry tracins show sinus rhythm There is no supraventricular or ventricular ectopy Min HR 47, Max HR 126, Avg HR 74 No diary submitted   Physical Exam:   VS:  BP 130/80 (BP Location: Left Arm,  Cuff Size: Normal)   Pulse 88   Ht 5' 3 (1.6 m)   Wt 139 lb 12.8 oz (63.4 kg)   SpO2 100%   BMI 24.76 kg/m    Wt Readings from Last 3 Encounters:  09/22/24 139 lb 12.8 oz (63.4 kg)  09/13/24 140 lb (63.5 kg)  12/08/23 134 lb (60.8 kg)     GEN: Pleasant female appearing in no acute distress NECK: No JVD; No carotid bruits CARDIAC: RRR, no murmurs, rubs, gallops RESPIRATORY:  Clear to auscultation without rales, wheezing or rhonchi  ABDOMEN: Appears non-distended. No obvious  abdominal masses. EXTREMITIES: No clubbing or cyanosis. No pitting edema.  Distal pedal pulses are 2+ bilaterally.   Assessment and Plan:   1. Palpitations - She reports worsening symptoms over the past several weeks and ED evaluation was overall reassuring and she was noted to have PACs by review of their notes but no CV strips are available for review. Electrolytes, Hgb and TSH were normal at that time. LFT's were elevated and she reports having a known history of this which is followed by her PCP. - Given her symptoms, we reviewed options and will plan to obtain a 3-day Zio patch to assess for significant arrhythmias. By her description, I suspect her symptoms are secondary to PAC's and PVC's and this will allow us  to quantify her burden. At this time, she does have Propranolol  and takes 10 mg as needed but has been on this for anxiety.   2. History of HTN - She was previously on Losartan 50 mg daily but reports not taking this for the past few weeks as blood pressure has been well-controlled when checked at home without the medication. I provided her with a BP log and encouraged her to return this in approximately 1 month. Based off her trend, may need to restart Losartan at a lower dose of 25 mg daily but will remain off medical therapy for now.  Signed, Laymon CHRISTELLA Qua, PA-C

## 2024-09-22 ENCOUNTER — Encounter: Payer: Self-pay | Admitting: Student

## 2024-09-22 ENCOUNTER — Ambulatory Visit: Attending: Student | Admitting: Student

## 2024-09-22 ENCOUNTER — Ambulatory Visit

## 2024-09-22 VITALS — BP 130/80 | HR 88 | Ht 63.0 in | Wt 139.8 lb

## 2024-09-22 DIAGNOSIS — R002 Palpitations: Secondary | ICD-10-CM

## 2024-09-22 DIAGNOSIS — Z8679 Personal history of other diseases of the circulatory system: Secondary | ICD-10-CM | POA: Insufficient documentation

## 2024-09-22 NOTE — Patient Instructions (Signed)
 Medication Instructions:  Your physician recommends that you continue on your current medications as directed. Please refer to the Current Medication list given to you today.  Monitor blood pressure for 1 month and record the readings. Drop off at office or send via MyChart.   *If you need a refill on your cardiac medications before your next appointment, please call your pharmacy*  Lab Work: NONE   If you have labs (blood work) drawn today and your tests are completely normal, you will receive your results only by: MyChart Message (if you have MyChart) OR A paper copy in the mail If you have any lab test that is abnormal or we need to change your treatment, we will call you to review the results.  Testing/Procedures: ZIO XT- Long Term Monitor Instructions   Your physician has requested you wear your ZIO patch monitor____3___days.   This is a single patch monitor.  [employer] supplies one patch monitor per enrollment.  Additional stickers are not available.   Please do not apply patch if you will be having a Nuclear Stress Test, Echocardiogram, Cardiac CT, MRI, or Chest Xray during the time frame you would be wearing the monitor. The patch cannot be worn during these tests.  You cannot remove and re-apply the ZIO XT patch monitor.    Once you have received you monitor, please review enclosed instructions.  Your monitor has already been registered assigning a specific monitor serial # to you.   Do not shower for the first 24 hours.  You may shower after the first 24 hours.   Press button if you feel a symptom. You will hear a small click.  Record Date, Time and Symptom in the Patient Log Book.   When you are ready to remove patch, follow instructions on last 2 pages of Patient Log Book.  Stick patch monitor onto last page of Patient Log Book.   Place Patient Log Book in [place] box.  Use locking tab on box and tape box closed securely.  The [employer] has [employer] on it.   Please place in mailbox as soon as possible.  Your physician should have your test results approximately 7 days after the monitor has been mailed back to [facility].   Call [facility] Customer Care at [phone] if you have questions regarding your ZIO XT patch monitor.  Call them immediately if you see an orange light blinking on your monitor.   If your monitor falls off in less than 4 days contact our Monitor department at [phone].  If your monitor becomes loose or falls off after 4 days call [employer] at [phone] for suggestions on securing your monitor.    Follow-Up: At [facility], you and your health needs are our priority.  As part of our continuing mission to provide you with exceptional heart care, our providers are all part of one team.  This team includes your primary Cardiologist (physician) and Advanced Practice Providers or APPs (Physician Assistants and Nurse Practitioners) who all work together to provide you with the care you need, when you need it.  Your next appointment:   2 month(s)  Provider:   Laymon Qua, PA-C    We recommend signing up for the patient portal called MyChart.  Sign up information is provided on this After Visit Summary.  MyChart is used to connect with patients for Virtual Visits (Telemedicine).  Patients are able to view lab/test results, encounter notes, upcoming appointments, etc.  Non-urgent messages can  be sent to your provider as well.   To learn more about what you can do with MyChart, go to [email or web address].   Other Instructions Thank you for choosing [facility]!

## 2024-10-09 DIAGNOSIS — R002 Palpitations: Secondary | ICD-10-CM | POA: Diagnosis not present

## 2024-10-10 ENCOUNTER — Ambulatory Visit: Payer: Self-pay | Admitting: Student

## 2024-10-24 NOTE — Progress Notes (Unsigned)
 BH MD/PA/NP OP Progress Note  10/25/2024 2:05 PM Kristin [name]  MRN:  [id number]  Visit Diagnosis:    ICD-10-CM   1. Generalized anxiety disorder with panic attacks  F41.1    F41.0     2. Dysthymia  F34.1     3. Nicotine use disorder  F17.200       Assessment: Kristin [name] is a 49 y.o. female with a history of Graves' disease, anxiety, and nicotine use disorder who presented to [facility] at [facility] for initial evaluation on 06/28/2024.    At initial evaluation patient reported symptoms of anxiety/panic including excessive worry, fear of something awful happening, feelings of being overwhelmed, racing thoughts, increased irritability, chest pressure, shortness of breath, palpitations, and body tension.  Furthermore she endorsed neurovegetative symptoms of depression including disturbed sleep, anhedonia, fatigue, and poor concentration.  Psychosocially there have been several stressors including her mother's Alzheimer's, relationship, and caring for her kids.  Patient met criteria for GAD with panic, dysthymia, and nicotine use disorder.  Kristin [name] presents for follow-up evaluation. Today, 10/25/24, patient has had minor improvement in anxiety symptoms.  She has not experienced any panic episodes in the interim and not required Xanax  at all.  She has he is propranolol  on a few occasions with good effect denying any adverse side effects.  Will continue on current regimen and follow-up in 2 months  Risk Assessment: An assessment of suicide and violence risk factors was performed as part of this evaluation and is not significantly changed from the last visit. While future psychiatric events cannot be accurately predicted, the patient does not currently require acute inpatient psychiatric care and does not currently meet [place]  involuntary commitment criteria. Patient was given contact information for crisis resources, behavioral health [name] and was  instructed to call 911 for emergencies.   Plan: # GAD with panic episodes/dysthymia Past medication trials: Lexapro Status of problem: Ongoing Interventions: - Hold Prozac  10 mg daily - Start propranolol  10 mg twice a day as needed for anxiety - Discontinue trazodone  25 to 50 mg nightly as needed for insomnia - Continue Xanax  0.5 mg twice daily as needed for anxiety second-line (managed by PCP), only used 0.25 mg twice in a 82-month interim  # Nicotine use disorder Past medication trials:  Status of problem: Ongoing Interventions: -- Continue nicotine patch 14 mg daily managed by PCP  Chief Complaint:  Chief Complaint  Patient presents with   Follow-up   HPI: Kristin [name] presents today reporting things have gone a bit better over the interim.  The holidays were enjoyable stressful and she is happy they are done.  Since their completion her oldest has gone back to college and her youngest is back into homeschool.  Patient is continuing to manage the schooling and care for the home.  She is also had to continue dealing with the care of her mother.  Her father and mother still live on their own and while her father is cognitively with it her mother's decline.  Now patient will frequently get calls from her father that he needs help caring for his mother as he has been unable to handle at all.  This has been stressful for Kristin [name] and she can find himself getting irritated.  Her father and mother had a home health aide but was recently stopped due to financial concerns.  Despite the stress around this patient reports that she has not needed the Xanax  at all over the past 2  months.  She has used the propranolol  on a couple occasions when her anxiety was elevating and found that it was effective.  She has noticed that her heart rate drops into the 50s but denies any lightheadedness or dizziness.  She has not noticed any adverse side effects from the medicine.  She did question length of efficacy being  only around 5 hours.  We reviewed that 4 to 6 hours is the typical duration of the medication and discussed long-acting formularies.  Given that patient is only using it on a as needed basis we decided that it would make most sense to continue on the current propranolol .  Did discuss Prozac  for baseline anxiety coverage however patient declined preferring to remain on as few medications as possible.  Also reviewed patient's ED presentation cardiologist visit with suspected PACs.  Past Psychiatric History:  Past psychiatric diagnoses: Denies Psychiatric hospitalizations: Denies Past suicide attempts: Denies Hx of self harm: Denies Hx of violence towards others: Denies Prior psychiatric providers: Denies Prior therapy: Denies Access to firearms: Yes safely secured  Prior medication trials: Lexapro (anxiety and insomnia possible that this was induced about looking at symptoms)  Substance use: Reports social alcohol use and drink 1-5 drinks at a time varies on time year.  Reports 20 years of nicotine use of anywhere from 2 to 10 cigarettes a day on nicotine patch. Marijuana at age 70 one trial. Not since.  Past Medical History:  Past Medical History:  Diagnosis Date   Anemia    Anxiety    Essential hypertension    GERD (gastroesophageal reflux disease)    Graves disease    History of UTI    Hyperthyroidism    Supervision of normal intrauterine pregnancy in multigravida 10/08/2011   Thyrotoxicosis     Past Surgical History:  Procedure Laterality Date   ECTOPIC PREGNANCY SURGERY     EYE SURGERY  09/09/2017   orbital decompression    TUBAL LIGATION  10/08/2011   Procedure: POST PARTUM TUBAL LIGATION;  Surgeon: Winton Felt, MD;  Location: [facility];  Service: Gynecology;  Laterality: Bilateral;   WISDOM TOOTH EXTRACTION      Family History:  Family History  Problem Relation Age of Onset   Diabetes Father    Heart disease Father 26       MI   High Cholesterol Mother     Hypertension Mother    Liver disease Sister    Heart disease Maternal Grandmother    Lupus Paternal Grandmother     Social History:  Social History   Socioeconomic History   Marital status: Significant Other    Spouse name: Not on file   Number of children: 2   Years of education: Not on file   Highest education level: Not on file  Occupational History   Occupation: [employer]  Tobacco Use   Smoking status: Every Day    Current packs/day: 0.00    Average packs/day: 0.5 packs/day for 20.0 years (10.0 ttl pk-yrs)    Types: Cigarettes    Start date: 04/22/1998    Last attempt to quit: 04/22/2018    Years since quitting: 6.5   Smokeless tobacco: Never  Vaping Use   Vaping status: Never Used  Substance and Sexual Activity   Alcohol use: Yes    Comment: occ   Drug use: No   Sexual activity: Yes    Birth control/protection: Surgical    Comment: tubal  Other Topics Concern   Not on file  Social History Narrative   Married   2 children: Age 59 y/o and 79 mos old (as of 45 OV)       Social Drivers of Health   Tobacco Use: High Risk (10/25/2024)   Patient History    Smoking Tobacco Use: Every Day    Smokeless Tobacco Use: Never    Passive Exposure: Not on file  Financial Resource Strain: Not on file  Food Insecurity: Not on file  Transportation Needs: Not on file  Physical Activity: Not on file  Stress: Not on file  Social Connections: Not on file  Depression (PHQ2-9): High Risk (06/28/2024)   Depression (PHQ2-9)    PHQ-2 Score: 17  Alcohol Screen: Not on file  Housing: Not on file  Utilities: Not on file  Health Literacy: Not on file    Allergies:  Allergies  Allergen Reactions   Sulfa  Antibiotics Palpitations    Rapid heart beats,tremores,anxiety    Current Medications: Current Outpatient Medications  Medication Sig Dispense Refill   acetaminophen  (TYLENOL ) 500 MG tablet Take by mouth.     ALPRAZolam  (XANAX ) 0.5 MG tablet Take 0.5 mg by mouth 4  (four) times daily as needed.     levothyroxine  (SYNTHROID ) 100 MCG tablet Take 100 mcg by mouth daily.     Omega-3 Fatty Acids (FISH OIL PO) Take by mouth.     pantoprazole  (PROTONIX ) 40 MG tablet Take 1 tablet (40 mg total) by mouth daily. 30 tablet 3   propranolol  (INDERAL ) 10 MG tablet Take 1 tablet (10 mg total) by mouth 2 (two) times daily as needed. 60 tablet 2   No current facility-administered medications for this visit.     Musculoskeletal: Strength & Muscle Tone: within normal limits Gait & Station: normal Patient leans: N/A  Psychiatric Specialty Exam: There were no vitals taken for this visit.There is no height or weight on file to calculate BMI. Review of Systems  General Appearance: Fairly Groomed  Eye Contact:  Good  Speech:  Clear and Coherent and Normal Rate  Volume:  Normal  Mood:  Anxious  Affect:  Appropriate  Thought Content: Logical   Suicidal Thoughts:  No  Homicidal Thoughts:  No  Thought Process:  Coherent  Orientation:  Full (Time, Place, and Person)    Memory: Immediate;   Good  Judgment:  Fair  Insight:  Fair  Concentration:  Concentration: Fair  Recall:  not formally assessed   Fund of Knowledge: Fair  Language: Good  Psychomotor Activity:  Normal  Akathisia:  NA  AIMS (if indicated): not done  Assets:  Communication Skills Desire for Improvement Financial Resources/Insurance Housing  ADL's:  Intact  Cognition: WNL  Sleep:  Good   Metabolic Disorder Labs: No results found for: HGBA1C, MPG No results found for: PROLACTIN Lab Results  Component Value Date   CHOL 150 12/13/2013   TRIG 68 12/13/2013   HDL 43 12/13/2013   CHOLHDL 3.5 12/13/2013   VLDL 14 12/13/2013   LDLCALC 93 12/13/2013   Lab Results  Component Value Date   TSH 4.340 09/13/2024   TSH 2.122 02/21/2016    Therapeutic Level Labs: No results found for: LITHIUM No results found for: VALPROATE No results found for: CBMZ   Screenings: PHQ2-9     Flowsheet Row Office [facility] from [facility] Office Visit from 07/11/2019 in [facility] Office Visit from 05/10/2018 in [facility]  PHQ-2 Total Score 6 2  0  PHQ-9 Total Score 17 7 --   Flowsheet Row ED from 09/13/2024 in [facility] Emergency Department at [facility] ED from 04/22/2022 in [facility] Emergency Department at [facility] ED from 03/08/2022 in [facility] Emergency Department at [facility]  C-SSRS RISK CATEGORY No Risk No Risk No Risk    Collaboration of Care: Collaboration of Care: Medication Management AEB medication continuation and Other provider involved in patient's care AEB ED and cardiology chart review  Patient/Guardian was advised Release of Information must be obtained prior to any record release in order to collaborate their care with an outside provider. Patient/Guardian was advised if they have not already done so to contact the registration department to sign all necessary forms in order for us  to release information regarding their care.   Consent: Patient/Guardian gives verbal consent for treatment and assignment of benefits for services provided during this visit. Patient/Guardian expressed understanding and agreed to proceed.    Arvella CHRISTELLA Finder, MD 10/25/2024, 2:05 PM

## 2024-10-25 ENCOUNTER — Encounter (HOSPITAL_COMMUNITY): Payer: Self-pay | Admitting: Psychiatry

## 2024-10-25 ENCOUNTER — Ambulatory Visit (HOSPITAL_COMMUNITY): Admitting: Psychiatry

## 2024-10-25 DIAGNOSIS — F411 Generalized anxiety disorder: Secondary | ICD-10-CM

## 2024-10-25 DIAGNOSIS — F410 Panic disorder [episodic paroxysmal anxiety] without agoraphobia: Secondary | ICD-10-CM | POA: Diagnosis not present

## 2024-10-25 DIAGNOSIS — F341 Dysthymic disorder: Secondary | ICD-10-CM

## 2024-10-25 DIAGNOSIS — F17200 Nicotine dependence, unspecified, uncomplicated: Secondary | ICD-10-CM | POA: Diagnosis not present

## 2024-11-24 ENCOUNTER — Ambulatory Visit: Admitting: Student

## 2024-11-25 ENCOUNTER — Ambulatory Visit: Admitting: Physician Assistant

## 2024-12-06 ENCOUNTER — Ambulatory Visit (HOSPITAL_COMMUNITY): Admitting: Psychiatry
